# Patient Record
Sex: Female | Born: 1975 | Race: White | Hispanic: No | Marital: Married | Smoking: Never smoker
Health system: Southern US, Community
[De-identification: ages and names within clinical notes are randomized; demographics above are authoritative.]

## PROBLEM LIST (undated history)

## (undated) DIAGNOSIS — R112 Nausea with vomiting, unspecified: Secondary | ICD-10-CM

## (undated) DIAGNOSIS — M549 Dorsalgia, unspecified: Secondary | ICD-10-CM

## (undated) DIAGNOSIS — M5416 Radiculopathy, lumbar region: Secondary | ICD-10-CM

## (undated) DIAGNOSIS — M51369 Other intervertebral disc degeneration, lumbar region without mention of lumbar back pain or lower extremity pain: Secondary | ICD-10-CM

## (undated) DIAGNOSIS — M533 Sacrococcygeal disorders, not elsewhere classified: Secondary | ICD-10-CM

## (undated) DIAGNOSIS — G8929 Other chronic pain: Secondary | ICD-10-CM

## (undated) DIAGNOSIS — M5136 Other intervertebral disc degeneration, lumbar region: Secondary | ICD-10-CM

## (undated) DIAGNOSIS — Z9889 Other specified postprocedural states: Secondary | ICD-10-CM

## (undated) HISTORY — PX: WISDOM TOOTH EXTRACTION: SHX21

## (undated) HISTORY — PX: BREAST ENHANCEMENT SURGERY: SHX7

## (undated) HISTORY — PX: TUBAL LIGATION: SHX77

## (undated) HISTORY — PX: OTHER SURGICAL HISTORY: SHX169

---

## 2006-11-02 ENCOUNTER — Emergency Department (HOSPITAL_COMMUNITY): Admission: EM | Admit: 2006-11-02 | Discharge: 2006-11-02 | Payer: Self-pay | Admitting: Emergency Medicine

## 2010-06-04 ENCOUNTER — Inpatient Hospital Stay (HOSPITAL_COMMUNITY): Admission: RE | Admit: 2010-06-04 | Discharge: 2010-06-07 | Payer: Self-pay | Admitting: Obstetrics and Gynecology

## 2010-06-04 ENCOUNTER — Encounter (HOSPITAL_COMMUNITY): Payer: Self-pay | Admitting: Obstetrics and Gynecology

## 2011-01-24 LAB — TYPE AND SCREEN: Antibody Screen: NEGATIVE

## 2011-01-24 LAB — GLUCOSE, CAPILLARY: Glucose-Capillary: 108 mg/dL — ABNORMAL HIGH (ref 70–99)

## 2011-01-24 LAB — CBC
Hemoglobin: 11.9 g/dL — ABNORMAL LOW (ref 12.0–15.0)
Hemoglobin: 9.1 g/dL — ABNORMAL LOW (ref 12.0–15.0)
MCH: 29.9 pg (ref 26.0–34.0)
MCH: 30 pg (ref 26.0–34.0)
MCHC: 33.6 g/dL (ref 30.0–36.0)
MCV: 88.7 fL (ref 78.0–100.0)
Platelets: 185 10*3/uL (ref 150–400)
Platelets: 243 10*3/uL (ref 150–400)
RBC: 3.05 MIL/uL — ABNORMAL LOW (ref 3.87–5.11)
RBC: 3.95 MIL/uL (ref 3.87–5.11)
WBC: 6.7 10*3/uL (ref 4.0–10.5)

## 2011-01-24 LAB — ABO/RH: ABO/RH(D): O POS

## 2011-01-24 LAB — RPR: RPR Ser Ql: NONREACTIVE

## 2015-02-27 ENCOUNTER — Other Ambulatory Visit: Payer: Self-pay | Admitting: Obstetrics and Gynecology

## 2015-02-28 LAB — CYTOLOGY - PAP

## 2017-11-18 ENCOUNTER — Emergency Department (HOSPITAL_BASED_OUTPATIENT_CLINIC_OR_DEPARTMENT_OTHER)
Admission: EM | Admit: 2017-11-18 | Discharge: 2017-11-18 | Disposition: A | Discharge status: Home / Self Care | Payer: BLUE CROSS/BLUE SHIELD | Attending: Emergency Medicine | Admitting: Emergency Medicine

## 2017-11-18 ENCOUNTER — Encounter (HOSPITAL_BASED_OUTPATIENT_CLINIC_OR_DEPARTMENT_OTHER): Payer: Self-pay | Admitting: Emergency Medicine

## 2017-11-18 ENCOUNTER — Other Ambulatory Visit: Payer: Self-pay

## 2017-11-18 DIAGNOSIS — M545 Low back pain, unspecified: Secondary | ICD-10-CM

## 2017-11-18 DIAGNOSIS — Z79899 Other long term (current) drug therapy: Secondary | ICD-10-CM | POA: Insufficient documentation

## 2017-11-18 HISTORY — DX: Dorsalgia, unspecified: M54.9

## 2017-11-18 HISTORY — DX: Other intervertebral disc degeneration, lumbar region without mention of lumbar back pain or lower extremity pain: M51.369

## 2017-11-18 HISTORY — DX: Sacrococcygeal disorders, not elsewhere classified: M53.3

## 2017-11-18 HISTORY — DX: Other chronic pain: G89.29

## 2017-11-18 HISTORY — DX: Radiculopathy, lumbar region: M54.16

## 2017-11-18 HISTORY — DX: Other intervertebral disc degeneration, lumbar region: M51.36

## 2017-11-18 MED ORDER — HYDROCODONE-ACETAMINOPHEN 5-325 MG PO TABS
2.0000 | ORAL_TABLET | Freq: Once | ORAL | Status: AC
Start: 2017-11-18 — End: 2017-11-18
  Administered 2017-11-18: 2 via ORAL
  Filled 2017-11-18: qty 2

## 2017-11-18 MED ORDER — HYDROCODONE-ACETAMINOPHEN 5-325 MG PO TABS: 1.0000 | ORAL_TABLET | Freq: Four times a day (QID) | ORAL | 0 refills | Status: DC | PRN

## 2017-11-18 MED ORDER — PREDNISONE 10 MG PO TABS: 20.0000 mg | ORAL_TABLET | Freq: Two times a day (BID) | ORAL | 0 refills | Status: DC

## 2017-11-18 MED ORDER — KETOROLAC TROMETHAMINE 60 MG/2ML IM SOLN
60.0000 mg | Freq: Once | INTRAMUSCULAR | Status: AC
  Administered 2017-11-18: 60 mg via INTRAMUSCULAR
  Filled 2017-11-18: qty 2

## 2017-11-18 NOTE — ED Triage Notes (Signed)
Pt c/o lower back pain. Pt states she has chronic back pain but has flared up over last week and significantly worsened last night.

## 2017-11-18 NOTE — Discharge Instructions (Signed)
Prednisone as prescribed.  Hydrocodone is prescribed as needed for pain.  Follow-up with your orthopedist or neurosurgeon in the next week for recheck.

## 2017-11-18 NOTE — ED Provider Notes (Addendum)
[facility] EMERGENCY DEPARTMENT Provider Note   CSN: [id number] Arrival date & time: 11/18/17  [id number]     History   Chief Complaint Chief Complaint  Patient presents with  . Back Pain    HPI Ashley SpearingJennifer L Petty is a 42 y.o. female.  Patient is a 42 year old female with past medical history of chronic low back pain presenting for evaluation of back pain.  She reports pain in her low back that started 2 days ago in the absence of any injury or trauma.  She denies any radiation of her pain into her legs.  She denies any bowel or bladder complaints.   The history is provided by the patient.  Back Pain   This is a recurrent problem. The current episode started 2 days ago. The problem occurs constantly. The problem has not changed since onset.The pain is associated with no known injury. The pain is present in the lumbar spine. The quality of the pain is described as stabbing. The pain does not radiate. The pain is severe. The symptoms are aggravated by bending, twisting and certain positions. The pain is the same all the time. Pertinent negatives include no numbness, no bowel incontinence, no bladder incontinence, no paresthesias, no tingling and no weakness. Treatments tried: Meloxicam. The treatment provided mild relief.    Past Medical History:  Diagnosis Date  . Back pain, chronic     There are no active problems to display for this patient.   Past Surgical History:  Procedure Laterality Date  . BREAST ENHANCEMENT SURGERY    . WISDOM TOOTH EXTRACTION      OB History    No data available       Home Medications    Prior to Admission medications   Medication Sig Start Date End Date Taking? Authorizing Provider  acetaminophen (TYLENOL) 325 MG tablet Take 650 mg by mouth as needed.   Yes [provider]  meloxicam (MOBIC) 15 MG tablet Take 15 mg by mouth daily.   Yes [provider]    Family History No family history on file.  Social  History Social History   Tobacco Use  . Smoking status: Never Smoker  . Smokeless tobacco: Never Used  Substance Use Topics  . Alcohol use: No    Frequency: Never  . Drug use: No     Allergies   Patient has no known allergies.   Review of Systems Review of Systems  Gastrointestinal: Negative for bowel incontinence.  Genitourinary: Negative for bladder incontinence.  Musculoskeletal: Positive for back pain.  Neurological: Negative for tingling, weakness, numbness and paresthesias.  All other systems reviewed and are negative.    Physical Exam Updated Vital Signs BP 112/70 (BP Location: Left Arm)   Pulse 73   Temp 98.7 F (37.1 C) (Oral)   Resp 20   Ht 5\' 7"  (1.702 m)   Wt 61.2 kg (135 lb)   SpO2 100%   BMI 21.14 kg/m   Physical Exam  Constitutional: She is oriented to person, place, and time. She appears well-developed and well-nourished. No distress.  HENT:  Head: Normocephalic and atraumatic.  Neck: Normal range of motion. Neck supple.  Musculoskeletal:  There is tenderness to palpation in the soft tissues of the lower lumbar region.  Neurological: She is alert and oriented to person, place, and time.  Strength is 5 out of 5.  She is ambulatory on her heels and toes without difficulty.  DTRs are 2+ and symmetrical in both patellar  and Achilles tendons.  Skin: Skin is warm and dry. She is not diaphoretic.  Nursing note and vitals reviewed.    ED Treatments / Results  Labs (all labs ordered are listed, but only abnormal results are displayed) Labs Reviewed - No data to display  EKG  EKG Interpretation None       Radiology No results found.  Procedures Procedures (including critical care time)  Medications Ordered in ED Medications  ketorolac (TORADOL) injection 60 mg (not administered)  HYDROcodone-acetaminophen (NORCO/VICODIN) 5-325 MG per tablet 2 tablet (not administered)     Initial Impression / Assessment and Plan / ED Course  I have  reviewed the triage vital signs and the nursing notes.  Pertinent labs & imaging results that were available during my care of the patient were reviewed by me and considered in my medical decision making (see chart for details).  Patient presents here with a flareup of her chronic back pain.  There are no red flags in her history or physical examination that would suggest an emergent situation.  She was given Toradol and Vicodin with some relief, however she does still complain of discomfort.  I have advised her to call her spine doctor this morning to arrange a follow-up appointment to discuss imaging studies or repeat steroid injections.  She will be given prednisone and hydrocodone.  The [facility] narcotic database has been reviewed and the patient has had one prescription in the last 2 years.  Final Clinical Impressions(s) / ED Diagnoses   Final diagnoses:  None    ED Discharge Orders    None       Geoffery Lyons, MD 11/18/17 [id number]    Geoffery Lyons, MD 11/18/17 [phone]

## 2017-11-22 ENCOUNTER — Other Ambulatory Visit: Payer: Self-pay | Admitting: Orthopedic Surgery

## 2017-11-22 DIAGNOSIS — M5416 Radiculopathy, lumbar region: Secondary | ICD-10-CM

## 2017-11-26 ENCOUNTER — Ambulatory Visit
Admission: RE | Admit: 2017-11-26 | Discharge: 2017-11-26 | Disposition: A | Discharge status: Home / Self Care | Payer: BLUE CROSS/BLUE SHIELD | Source: Ambulatory Visit | Attending: Orthopedic Surgery | Admitting: Orthopedic Surgery

## 2017-11-26 DIAGNOSIS — M5416 Radiculopathy, lumbar region: Secondary | ICD-10-CM

## 2017-12-02 ENCOUNTER — Other Ambulatory Visit: Payer: Self-pay | Admitting: Orthopedic Surgery

## 2017-12-08 ENCOUNTER — Encounter (HOSPITAL_COMMUNITY): Payer: Self-pay | Admitting: *Deleted

## 2017-12-08 ENCOUNTER — Other Ambulatory Visit: Payer: Self-pay

## 2017-12-08 NOTE — Progress Notes (Signed)
Spoke with pt for pre-op call. Pt denies cardiac history, chest pain, sob or diabetes. 

## 2017-12-08 NOTE — Anesthesia Preprocedure Evaluation (Addendum)
Anesthesia Evaluation  Patient identified by MRN, date of birth, ID band Patient awake    Reviewed: Allergy & Precautions, H&P , Patient's Chart, lab work & pertinent test results, reviewed documented beta blocker date and time   History of Anesthesia Complications (+) PONV and history of anesthetic complications  Airway Mallampati: II  TM Distance: >3 FB Neck ROM: full    Dental no notable dental hx. (+) Teeth Intact, Dental Advisory Given   Pulmonary    Pulmonary exam normal breath sounds clear to auscultation       Cardiovascular  Rhythm:regular Rate:Normal     Neuro/Psych    GI/Hepatic   Endo/Other    Renal/GU      Musculoskeletal   Abdominal   Peds  Hematology   Anesthesia Other Findings   Reproductive/Obstetrics                            Anesthesia Physical Anesthesia Plan  ASA: II  Anesthesia Plan: General   Post-op Pain Management:    Induction: Intravenous  PONV Risk Score and Plan: 3 and Dexamethasone, Ondansetron, Treatment may vary due to age or medical condition and Scopolamine patch - Pre-op  Airway Management Planned: Oral ETT  Additional Equipment:   Intra-op Plan:   Post-operative Plan: Extubation in OR  Informed Consent: I have reviewed the patients History and Physical, chart, labs and discussed the procedure including the risks, benefits and alternatives for the proposed anesthesia with the patient or authorized representative who has indicated his/her understanding and acceptance.   Dental Advisory Given  Plan Discussed with: CRNA and Surgeon  Anesthesia Plan Comments: (  )        Anesthesia Quick Evaluation

## 2017-12-09 ENCOUNTER — Ambulatory Visit (HOSPITAL_COMMUNITY): Payer: BLUE CROSS/BLUE SHIELD | Admitting: Anesthesiology

## 2017-12-09 ENCOUNTER — Ambulatory Visit (HOSPITAL_COMMUNITY)
Admission: RE | Admit: 2017-12-09 | Discharge: 2017-12-09 | Disposition: A | Discharge status: Home / Self Care | Payer: BLUE CROSS/BLUE SHIELD | Source: Ambulatory Visit | Attending: Orthopedic Surgery | Admitting: Orthopedic Surgery

## 2017-12-09 ENCOUNTER — Ambulatory Visit (HOSPITAL_COMMUNITY): Payer: BLUE CROSS/BLUE SHIELD

## 2017-12-09 ENCOUNTER — Ambulatory Visit (HOSPITAL_COMMUNITY)
Admission: RE | Disposition: A | Discharge status: Home / Self Care | Payer: Self-pay | Source: Ambulatory Visit | Attending: Orthopedic Surgery

## 2017-12-09 ENCOUNTER — Other Ambulatory Visit: Payer: Self-pay

## 2017-12-09 ENCOUNTER — Encounter (HOSPITAL_COMMUNITY): Payer: Self-pay | Admitting: *Deleted

## 2017-12-09 DIAGNOSIS — G8929 Other chronic pain: Secondary | ICD-10-CM | POA: Insufficient documentation

## 2017-12-09 DIAGNOSIS — Z820 Family history of epilepsy and other diseases of the nervous system: Secondary | ICD-10-CM | POA: Diagnosis not present

## 2017-12-09 DIAGNOSIS — Z79891 Long term (current) use of opiate analgesic: Secondary | ICD-10-CM | POA: Insufficient documentation

## 2017-12-09 DIAGNOSIS — Z791 Long term (current) use of non-steroidal anti-inflammatories (NSAID): Secondary | ICD-10-CM | POA: Insufficient documentation

## 2017-12-09 DIAGNOSIS — Z01818 Encounter for other preprocedural examination: Secondary | ICD-10-CM

## 2017-12-09 DIAGNOSIS — M5410 Radiculopathy, site unspecified: Secondary | ICD-10-CM

## 2017-12-09 DIAGNOSIS — M5117 Intervertebral disc disorders with radiculopathy, lumbosacral region: Secondary | ICD-10-CM | POA: Diagnosis present

## 2017-12-09 DIAGNOSIS — Z9889 Other specified postprocedural states: Secondary | ICD-10-CM | POA: Diagnosis not present

## 2017-12-09 DIAGNOSIS — Z9851 Tubal ligation status: Secondary | ICD-10-CM | POA: Diagnosis not present

## 2017-12-09 DIAGNOSIS — Z888 Allergy status to other drugs, medicaments and biological substances status: Secondary | ICD-10-CM | POA: Diagnosis not present

## 2017-12-09 DIAGNOSIS — Z793 Long term (current) use of hormonal contraceptives: Secondary | ICD-10-CM | POA: Diagnosis not present

## 2017-12-09 DIAGNOSIS — Z79899 Other long term (current) drug therapy: Secondary | ICD-10-CM | POA: Insufficient documentation

## 2017-12-09 HISTORY — DX: Other specified postprocedural states: Z98.890

## 2017-12-09 HISTORY — PX: LUMBAR LAMINECTOMY: SHX95

## 2017-12-09 HISTORY — DX: Nausea with vomiting, unspecified: R11.2

## 2017-12-09 LAB — COMPREHENSIVE METABOLIC PANEL
ALK PHOS: 37 U/L — AB (ref 38–126)
ALT: 19 U/L (ref 14–54)
AST: 22 U/L (ref 15–41)
Albumin: 3.9 g/dL (ref 3.5–5.0)
Anion gap: 12 (ref 5–15)
BILIRUBIN TOTAL: 1 mg/dL (ref 0.3–1.2)
BUN: 15 mg/dL (ref 6–20)
CALCIUM: 9 mg/dL (ref 8.9–10.3)
CO2: 22 mmol/L (ref 22–32)
Chloride: 105 mmol/L (ref 101–111)
Creatinine, Ser: 0.62 mg/dL (ref 0.44–1.00)
Glucose, Bld: 106 mg/dL — ABNORMAL HIGH (ref 65–99)
Potassium: 3.7 mmol/L (ref 3.5–5.1)
Sodium: 139 mmol/L (ref 135–145)
Total Protein: 6.6 g/dL (ref 6.5–8.1)

## 2017-12-09 LAB — URINALYSIS, ROUTINE W REFLEX MICROSCOPIC
BILIRUBIN URINE: NEGATIVE
GLUCOSE, UA: NEGATIVE mg/dL
Ketones, ur: NEGATIVE mg/dL
LEUKOCYTES UA: NEGATIVE
Nitrite: NEGATIVE
PH: 5 (ref 5.0–8.0)
PROTEIN: NEGATIVE mg/dL
Specific Gravity, Urine: 1.023 (ref 1.005–1.030)

## 2017-12-09 LAB — PROTIME-INR
INR: 0.99
PROTHROMBIN TIME: 13 s (ref 11.4–15.2)

## 2017-12-09 LAB — CBC WITH DIFFERENTIAL/PLATELET
BASOS ABS: 0 10*3/uL (ref 0.0–0.1)
Basophils Relative: 1 %
Eosinophils Absolute: 0.1 10*3/uL (ref 0.0–0.7)
Eosinophils Relative: 2 %
HEMATOCRIT: 41.7 % (ref 36.0–46.0)
HEMOGLOBIN: 13.7 g/dL (ref 12.0–15.0)
LYMPHS ABS: 2.5 10*3/uL (ref 0.7–4.0)
LYMPHS PCT: 38 %
MCH: 30.6 pg (ref 26.0–34.0)
MCHC: 32.9 g/dL (ref 30.0–36.0)
MCV: 93.3 fL (ref 78.0–100.0)
Monocytes Absolute: 0.4 10*3/uL (ref 0.1–1.0)
Monocytes Relative: 6 %
NEUTROS ABS: 3.6 10*3/uL (ref 1.7–7.7)
NEUTROS PCT: 53 %
Platelets: 215 10*3/uL (ref 150–400)
RBC: 4.47 MIL/uL (ref 3.87–5.11)
RDW: 13 % (ref 11.5–15.5)
WBC: 6.6 10*3/uL (ref 4.0–10.5)

## 2017-12-09 LAB — TYPE AND SCREEN
ABO/RH(D): O POS
Antibody Screen: NEGATIVE

## 2017-12-09 LAB — POCT PREGNANCY, URINE: Preg Test, Ur: NEGATIVE

## 2017-12-09 LAB — APTT: APTT: 30 s (ref 24–36)

## 2017-12-09 LAB — ABO/RH: ABO/RH(D): O POS

## 2017-12-09 SURGERY — MICRODISCECTOMY LUMBAR LAMINECTOMY
Anesthesia: General | Laterality: Right

## 2017-12-09 MED ORDER — PHENYLEPHRINE HCL 10 MG/ML IJ SOLN
INTRAMUSCULAR | Status: DC | PRN
  Administered 2017-12-09 (×3): 80 ug via INTRAVENOUS
  Administered 2017-12-09: 120 ug via INTRAVENOUS
  Administered 2017-12-09: 80 ug via INTRAVENOUS

## 2017-12-09 MED ORDER — THROMBIN 20000 UNITS EX SOLR
CUTANEOUS | Status: AC
  Filled 2017-12-09: qty 20000

## 2017-12-09 MED ORDER — BUPIVACAINE-EPINEPHRINE (PF) 0.5% -1:200000 IJ SOLN
INTRAMUSCULAR | Status: AC
  Filled 2017-12-09: qty 30

## 2017-12-09 MED ORDER — ONDANSETRON HCL 4 MG/2ML IJ SOLN
INTRAMUSCULAR | Status: DC | PRN
  Administered 2017-12-09: 4 mg via INTRAVENOUS

## 2017-12-09 MED ORDER — NEOSTIGMINE METHYLSULFATE 10 MG/10ML IV SOLN
INTRAVENOUS | Status: DC | PRN
Start: 2017-12-09 — End: 2017-12-09
  Administered 2017-12-09: 3.5 mg via INTRAVENOUS

## 2017-12-09 MED ORDER — THROMBIN 5000 UNITS EX SOLR
CUTANEOUS | Status: AC
  Filled 2017-12-09: qty 5000

## 2017-12-09 MED ORDER — LIDOCAINE HCL (CARDIAC) 20 MG/ML IV SOLN
INTRAVENOUS | Status: DC | PRN
  Administered 2017-12-09: 100 mg via INTRAVENOUS

## 2017-12-09 MED ORDER — METHYLPREDNISOLONE ACETATE 40 MG/ML IJ SUSP
INTRAMUSCULAR | Status: DC | PRN
  Administered 2017-12-09: 40 mg

## 2017-12-09 MED ORDER — FENTANYL CITRATE (PF) 100 MCG/2ML IJ SOLN
INTRAMUSCULAR | Status: DC | PRN
  Administered 2017-12-09: 150 ug via INTRAVENOUS
  Administered 2017-12-09: 50 ug via INTRAVENOUS

## 2017-12-09 MED ORDER — HYDROMORPHONE HCL 1 MG/ML IJ SOLN
0.2500 mg | INTRAMUSCULAR | Status: DC | PRN
  Administered 2017-12-09 (×4): 0.5 mg via INTRAVENOUS

## 2017-12-09 MED ORDER — GLYCOPYRROLATE 0.2 MG/ML IJ SOLN
INTRAMUSCULAR | Status: DC | PRN
  Administered 2017-12-09: 0.4 mg via INTRAVENOUS

## 2017-12-09 MED ORDER — DEXAMETHASONE SODIUM PHOSPHATE 10 MG/ML IJ SOLN
INTRAMUSCULAR | Status: AC
  Filled 2017-12-09: qty 1

## 2017-12-09 MED ORDER — PHENYLEPHRINE HCL 10 MG/ML IJ SOLN
INTRAVENOUS | Status: DC | PRN
  Administered 2017-12-09: 25 ug/min via INTRAVENOUS

## 2017-12-09 MED ORDER — HYDROMORPHONE HCL 1 MG/ML IJ SOLN
INTRAMUSCULAR | Status: AC
  Administered 2017-12-09: 0.5 mg via INTRAVENOUS
  Filled 2017-12-09: qty 1

## 2017-12-09 MED ORDER — MIDAZOLAM HCL 5 MG/5ML IJ SOLN
INTRAMUSCULAR | Status: DC | PRN
  Administered 2017-12-09: 2 mg via INTRAVENOUS

## 2017-12-09 MED ORDER — ROCURONIUM BROMIDE 100 MG/10ML IV SOLN
INTRAVENOUS | Status: DC | PRN
  Administered 2017-12-09: 50 mg via INTRAVENOUS

## 2017-12-09 MED ORDER — HEMOSTATIC AGENTS (NO CHARGE) OPTIME
TOPICAL | Status: DC | PRN
  Administered 2017-12-09: 1 via TOPICAL

## 2017-12-09 MED ORDER — ONDANSETRON HCL 4 MG/2ML IJ SOLN
INTRAMUSCULAR | Status: AC
  Filled 2017-12-09: qty 2

## 2017-12-09 MED ORDER — 0.9 % SODIUM CHLORIDE (POUR BTL) OPTIME
TOPICAL | Status: DC | PRN
  Administered 2017-12-09: 1000 mL

## 2017-12-09 MED ORDER — METHYLPREDNISOLONE ACETATE 40 MG/ML IJ SUSP
INTRAMUSCULAR | Status: AC
  Filled 2017-12-09: qty 1

## 2017-12-09 MED ORDER — FENTANYL CITRATE (PF) 250 MCG/5ML IJ SOLN
INTRAMUSCULAR | Status: AC
  Filled 2017-12-09: qty 5

## 2017-12-09 MED ORDER — MIDAZOLAM HCL 2 MG/2ML IJ SOLN
INTRAMUSCULAR | Status: AC
  Filled 2017-12-09: qty 2

## 2017-12-09 MED ORDER — BUPIVACAINE-EPINEPHRINE 0.5% -1:200000 IJ SOLN
INTRAMUSCULAR | Status: DC | PRN
  Administered 2017-12-09: 20 mL

## 2017-12-09 MED ORDER — POVIDONE-IODINE 7.5 % EX SOLN
Freq: Once | CUTANEOUS | Status: DC
  Filled 2017-12-09: qty 118

## 2017-12-09 MED ORDER — THROMBIN (RECOMBINANT) 20000 UNITS EX SOLR
CUTANEOUS | Status: DC | PRN
  Administered 2017-12-09: 20000 [IU] via TOPICAL

## 2017-12-09 MED ORDER — ACETAMINOPHEN 160 MG/5ML PO SOLN
960.0000 mg | Freq: Once | ORAL | Status: AC
  Administered 2017-12-09: 960 mg via ORAL
  Filled 2017-12-09: qty 40.6

## 2017-12-09 MED ORDER — LIDOCAINE 2% (20 MG/ML) 5 ML SYRINGE
INTRAMUSCULAR | Status: AC
  Filled 2017-12-09: qty 5

## 2017-12-09 MED ORDER — LACTATED RINGERS IV SOLN
INTRAVENOUS | Status: DC | PRN
  Administered 2017-12-09 (×2): via INTRAVENOUS

## 2017-12-09 MED ORDER — PROPOFOL 10 MG/ML IV BOLUS
INTRAVENOUS | Status: AC
  Filled 2017-12-09: qty 20

## 2017-12-09 MED ORDER — PROPOFOL 10 MG/ML IV BOLUS
INTRAVENOUS | Status: DC | PRN
  Administered 2017-12-09: 130 mg via INTRAVENOUS

## 2017-12-09 MED ORDER — INDIGOTINDISULFONATE SODIUM 8 MG/ML IJ SOLN
INTRAMUSCULAR | Status: DC | PRN
  Administered 2017-12-09: .03 mL via INTRAVENOUS

## 2017-12-09 MED ORDER — SCOPOLAMINE 1 MG/3DAYS TD PT72
MEDICATED_PATCH | TRANSDERMAL | Status: DC | PRN
  Administered 2017-12-09: 1 via TRANSDERMAL

## 2017-12-09 MED ORDER — CEFAZOLIN SODIUM-DEXTROSE 2-4 GM/100ML-% IV SOLN
2.0000 g | INTRAVENOUS | Status: AC
  Administered 2017-12-09: 2 g via INTRAVENOUS
  Filled 2017-12-09: qty 100

## 2017-12-09 MED ORDER — METHYLENE BLUE 0.5 % INJ SOLN
INTRAVENOUS | Status: AC
  Filled 2017-12-09: qty 10

## 2017-12-09 MED ORDER — DEXAMETHASONE SODIUM PHOSPHATE 10 MG/ML IJ SOLN
INTRAMUSCULAR | Status: DC | PRN
  Administered 2017-12-09: 10 mg via INTRAVENOUS

## 2017-12-09 MED ORDER — BUPIVACAINE LIPOSOME 1.3 % IJ SUSP
20.0000 mL | INTRAMUSCULAR | Status: AC
  Administered 2017-12-09: 20 mL
  Filled 2017-12-09: qty 20

## 2017-12-09 MED ORDER — SCOPOLAMINE 1 MG/3DAYS TD PT72
MEDICATED_PATCH | TRANSDERMAL | Status: AC
  Filled 2017-12-09: qty 1

## 2017-12-09 MED ORDER — ROCURONIUM BROMIDE 10 MG/ML (PF) SYRINGE
PREFILLED_SYRINGE | INTRAVENOUS | Status: AC
  Filled 2017-12-09: qty 5

## 2017-12-09 SURGICAL SUPPLY — 66 items
APL SKNCLS STERI-STRIP NONHPOA (GAUZE/BANDAGES/DRESSINGS) ×1
BENZOIN TINCTURE PRP APPL 2/3 (GAUZE/BANDAGES/DRESSINGS) ×1 IMPLANT
BUR ROUND PRECISION 4.0 (BURR) ×1 IMPLANT
CANISTER SUCT 3000ML PPV (MISCELLANEOUS) ×2 IMPLANT
CONT SPEC 4OZ CLIKSEAL STRL BL (MISCELLANEOUS) ×2 IMPLANT
COVER SURGICAL LIGHT HANDLE (MISCELLANEOUS) ×2 IMPLANT
DRAIN CHANNEL 10F 3/8 F FF (DRAIN) IMPLANT
DRAPE POUCH INSTRU U-SHP 10X18 (DRAPES) ×3 IMPLANT
DRAPE SURG 17X23 STRL (DRAPES) ×8 IMPLANT
DURAPREP 26ML APPLICATOR (WOUND CARE) ×2 IMPLANT
ELECT BLADE 4.0 EZ CLEAN MEGAD (MISCELLANEOUS) ×2
ELECT BLADE 6.5 EXT (BLADE) IMPLANT
ELECT CAUTERY BLADE 6.4 (BLADE) ×2 IMPLANT
ELECT REM PT RETURN 9FT ADLT (ELECTROSURGICAL) ×2
ELECTRODE BLDE 4.0 EZ CLN MEGD (MISCELLANEOUS) IMPLANT
ELECTRODE REM PT RTRN 9FT ADLT (ELECTROSURGICAL) ×1 IMPLANT
EVACUATOR SILICONE 100CC (DRAIN) IMPLANT
FILTER STRAW FLUID ASPIR (MISCELLANEOUS) ×2 IMPLANT
GAUZE SPONGE 4X4 12PLY STRL (GAUZE/BANDAGES/DRESSINGS) ×2 IMPLANT
GAUZE SPONGE 4X4 16PLY XRAY LF (GAUZE/BANDAGES/DRESSINGS) ×4 IMPLANT
GLOVE BIO SURGEON STRL SZ7 (GLOVE) ×2 IMPLANT
GLOVE BIO SURGEON STRL SZ8 (GLOVE) ×2 IMPLANT
GLOVE BIOGEL PI IND STRL 7.0 (GLOVE) ×1 IMPLANT
GLOVE BIOGEL PI IND STRL 8 (GLOVE) ×1 IMPLANT
GLOVE BIOGEL PI INDICATOR 7.0 (GLOVE) ×4
GLOVE BIOGEL PI INDICATOR 8 (GLOVE) ×1
GLOVE SURG SS PI 6.5 STRL IVOR (GLOVE) ×2 IMPLANT
GOWN STRL REUS W/ TWL LRG LVL3 (GOWN DISPOSABLE) ×2 IMPLANT
GOWN STRL REUS W/TWL LRG LVL3 (GOWN DISPOSABLE) ×4
IV CATH 14GX2 1/4 (CATHETERS) ×2 IMPLANT
KIT BASIN OR (CUSTOM PROCEDURE TRAY) ×2 IMPLANT
KIT ROOM TURNOVER OR (KITS) ×2 IMPLANT
NDL 18GX1X1/2 (RX/OR ONLY) (NEEDLE) ×1 IMPLANT
NDL HYPO 25GX1X1/2 BEV (NEEDLE) ×1 IMPLANT
NDL SPNL 18GX3.5 QUINCKE PK (NEEDLE) ×2 IMPLANT
NDL SPNL 22GX3.5 QUINCKE BK (NEEDLE) IMPLANT
NEEDLE 18GX1X1/2 (RX/OR ONLY) (NEEDLE) ×2 IMPLANT
NEEDLE HYPO 25GX1X1/2 BEV (NEEDLE) ×4 IMPLANT
NEEDLE SPNL 18GX3.5 QUINCKE PK (NEEDLE) ×4 IMPLANT
NEEDLE SPNL 22GX3.5 QUINCKE BK (NEEDLE) IMPLANT
NS IRRIG 1000ML POUR BTL (IV SOLUTION) ×2 IMPLANT
PACK LAMINECTOMY ORTHO (CUSTOM PROCEDURE TRAY) ×2 IMPLANT
PACK UNIVERSAL I (CUSTOM PROCEDURE TRAY) ×2 IMPLANT
PAD ARMBOARD 7.5X6 YLW CONV (MISCELLANEOUS) ×4 IMPLANT
PATTIES SURGICAL .5 X.5 (GAUZE/BANDAGES/DRESSINGS) IMPLANT
PATTIES SURGICAL .5 X1 (DISPOSABLE) ×2 IMPLANT
PATTIES SURGICAL 1X1 (DISPOSABLE) IMPLANT
SPONGE SURGIFOAM ABS GEL 100 (HEMOSTASIS) ×2 IMPLANT
STRIP CLOSURE SKIN 1/2X4 (GAUZE/BANDAGES/DRESSINGS) ×1 IMPLANT
SUT ETHILON 3 0 FSL (SUTURE) IMPLANT
SUT VIC AB 0 CT1 27 (SUTURE)
SUT VIC AB 0 CT1 27XBRD ANBCTR (SUTURE) IMPLANT
SUT VIC AB 0 CT2 27 (SUTURE) ×1 IMPLANT
SUT VIC AB 1 CT1 18XCR BRD 8 (SUTURE) ×1 IMPLANT
SUT VIC AB 1 CT1 8-18 (SUTURE) ×4
SUT VIC AB 2-0 CT2 18 VCP726D (SUTURE) ×2 IMPLANT
SYR 20CC LL (SYRINGE) IMPLANT
SYR BULB IRRIGATION 50ML (SYRINGE) ×2 IMPLANT
SYR CONTROL 10ML LL (SYRINGE) ×2 IMPLANT
SYR TB 1ML 26GX3/8 SAFETY (SYRINGE) ×4 IMPLANT
SYR TB 1ML LUER SLIP (SYRINGE) ×4 IMPLANT
TAPE CLOTH SURG 4X10 WHT LF (GAUZE/BANDAGES/DRESSINGS) ×1 IMPLANT
TOWEL OR 17X24 6PK STRL BLUE (TOWEL DISPOSABLE) ×2 IMPLANT
TOWEL OR 17X26 10 PK STRL BLUE (TOWEL DISPOSABLE) ×2 IMPLANT
WATER STERILE IRR 1000ML POUR (IV SOLUTION) ×1 IMPLANT
YANKAUER SUCT BULB TIP NO VENT (SUCTIONS) ×2 IMPLANT

## 2017-12-09 NOTE — Op Note (Signed)
NAMJerolyn Shin:  Alto, Mehak           ACCOUNT NO.:  [id number]  MEDICAL RECORD NO.:  [id number]  LOCATION:  [facility]                         FACILITY:  [facility]  PHYSICIAN:  Estill BambergMark Dashanna Kinnamon, MD      DATE OF BIRTH:  [birth year]  DATE OF PROCEDURE:  12/09/2017                              OPERATIVE REPORT   PREOPERATIVE DIAGNOSES: 1. Right-sided S1 radiculopathy. 2. 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  POSTOPERATIVE DIAGNOSES: 1. Right-sided S1 radiculopathy. 2. 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  PROCEDURES:  Right-sided L5-S1 laminotomy with partial facetectomy and removal of very large herniated right-sided L5-S1 disk fragment.  SURGEON:  Estill BambergMark Dezman Granda, MD.  ASSISTANJason Coop:  Kayla McKenzie, PA-C.  ANESTHESIA:  General endotracheal anesthesia.  COMPLICATIONS:  None.  DISPOSITION:  Stable.  ESTIMATED BLOOD LOSS:  Minimal.  INDICATIONS FOR SURGERY:  Briefly, Ashley Petty is a pleasant 42 year old female, who did present to me with severe pain in her low back and right leg.  The patient's MRI did reveal the findings outlined above, clearly notable for a very large herniated disk fragment.  Her pain was rather severe and she did have weakness in her leg as well.  We did discuss treatment options and we did ultimately elect to proceed with the procedure reflected above.  The patient was fully made aware of the risks of surgery, including the risk of recurrent herniation and the need for subsequent surgery, including the possibility of a subsequent diskectomy and/or fusion.  OPERATIVE DETAILS:  On December 09, 2017, the patient was brought to Surgery and general endotracheal anesthesia was administered.  The patient was placed prone on a well-padded flat Jackson bed with a spinal frame.  Antibiotics were given.  The back was prepped and draped and a time-out procedure  was performed.  At this point, a midline incision was made directly over the L5-S1 intervertebral space.  A curvilinear incision was made just to the right of the midline into the fascia.  A self-retaining McCulloch retractor was placed.  The lamina of L5 and S1 was identified and subperiosteally exposed.  I then removed the lateral aspect of the L5-S1 ligamentum flavum.  Readily identified was the traversing right S1 nerve, which was noted to be under obvious tension, and was noted to be rather erythematous.  I was able to gently gain medial retraction of the nerve, and in doing so, a very large herniated disk fragment was readily noted.  This was removed in one very large fragment.  This substantially lessened the tension on the nerve.  I then additionally explored the lateral recess, and with additional medial retraction of the nerve, an additional very large herniation was readily encountered, and was uneventfully removed.  I was very pleased with the final decompression that I was able to accomplish.  At this point, the wound was copiously irrigated with normal saline.  All epidural bleeding was controlled using bipolar electrocautery in addition to Surgiflo. All bleeding was controlled at the termination of the procedure.  At this point, 20 mg of Depo-Medrol was introduced about the epidural space  in the region of the right S1 nerve.  The wound was then closed in layers using #1 Vicryl followed by 0 Vicryl, followed by 4-0 Monocryl. Benzoin and Steri-Strips were applied followed by a sterile dressing. All instrument counts were correct at the termination of the procedure.  Of note, Jason Coop was my assistant throughout surgery, and did aid in retraction, suctioning, and closure from start to finish.     Estill Bamberg, MD     MD/MEDQ  D:  12/09/2017  T:  12/09/2017  Job:  [id number]

## 2017-12-09 NOTE — Transfer of Care (Signed)
Immediate Anesthesia Transfer of Care Note  Patient: Shella SpearingJennifer L Heggs  Procedure(s) Performed: RIGHT SIDED LUMBAR 5-SACRUM 1 MICRODISCECTOMY REQUESTED TIME 2 HRS (Right )  Patient Location: PACU  Anesthesia Type:General  Level of Consciousness: oriented, drowsy and patient cooperative  Airway & Oxygen Therapy: Patient Spontanous Breathing and Patient connected to nasal cannula oxygen  Post-op Assessment: Report given to RN and Post -op Vital signs reviewed and stable  Post vital signs: Reviewed  Last Vitals:  Vitals:   12/09/17 0647  BP: 121/68  Pulse: 61  Resp: 18  Temp: 37.1 C  SpO2: 100%    Last Pain:  Vitals:   12/09/17 0647  TempSrc: Oral  PainSc:       Patients Stated Pain Goal: 2 (12/09/17 [id number])  Complications: No apparent anesthesia complications

## 2017-12-09 NOTE — Anesthesia Procedure Notes (Signed)
Procedure Name: Intubation Date/Time: 12/09/2017 7:38 AM Performed by: Lovie Cholock, Tristan K, CRNA Pre-anesthesia Checklist: Patient identified, Emergency Drugs available, Suction available and Patient being monitored Patient Re-evaluated:Patient Re-evaluated prior to induction Oxygen Delivery Method: Circle System Utilized Preoxygenation: Pre-oxygenation with 100% oxygen Induction Type: IV induction Ventilation: Mask ventilation without difficulty Laryngoscope Size: Miller and 2 Grade View: Grade I Tube type: Oral Tube size: 7.0 mm Number of attempts: 1 Airway Equipment and Method: Stylet and Oral airway Placement Confirmation: ETT inserted through vocal cords under direct vision,  positive ETCO2 and breath sounds checked- equal and bilateral Secured at: 21 cm Tube secured with: Tape Dental Injury: Teeth and Oropharynx as per pre-operative assessment

## 2017-12-09 NOTE — Anesthesia Postprocedure Evaluation (Signed)
Anesthesia Post Note  Patient: Shella SpearingJennifer L Hogenson  Procedure(s) Performed: RIGHT SIDED LUMBAR 5-SACRUM 1 MICRODISCECTOMY REQUESTED TIME 2 HRS (Right )     Patient location during evaluation: PACU Anesthesia Type: General Level of consciousness: awake and alert Pain management: pain level controlled Vital Signs Assessment: post-procedure vital signs reviewed and stable Respiratory status: spontaneous breathing, nonlabored ventilation, respiratory function stable and patient connected to nasal cannula oxygen Cardiovascular status: blood pressure returned to baseline and stable Postop Assessment: no apparent nausea or vomiting Anesthetic complications: no    Last Vitals:  Vitals:   12/09/17 1100 12/09/17 1110  BP: (!) 96/58   Pulse: 62 62  Resp: 10 12  Temp:    SpO2: 95% 96%    Last Pain:  Vitals:   12/09/17 1054  TempSrc:   PainSc: 4                  Izayiah Tibbitts EDWARD

## 2017-12-09 NOTE — H&P (Signed)
PREOPERATIVE H&P  Chief Complaint: Right leg pain  HPI: [name] is Ashley 42 y.o. female who presents with ongoing pain in the right leg  MRI reveals Ashley very large right L5/S1 HNP  Patient has failed multiple forms of conservative care and continues to have pain (see office notes for additional details regarding the patient's full course of treatment)  Past Medical History:  Diagnosis Date  . Back pain, chronic   . DDD (degenerative disc disease), lumbar   . Lumbar radiculopathy   . PONV (postoperative nausea and vomiting)   . Sacroiliac dysfunction    Past Surgical History:  Procedure Laterality Date  . BREAST ENHANCEMENT SURGERY    . c-sections     x 4  . TUBAL LIGATION    . WISDOM TOOTH EXTRACTION     Social History   Socioeconomic History  . Marital status: Married    Spouse name: None  . Number of children: None  . Years of education: None  . Highest education level: None  Social Needs  . Financial resource strain: None  . Food insecurity - worry: None  . Food insecurity - inability: None  . Transportation needs - medical: None  . Transportation needs - non-medical: None  Occupational History  . None  Tobacco Use  . Smoking status: Never Smoker  . Smokeless tobacco: Never Used  Substance and Sexual Activity  . Alcohol use: No    Frequency: Never  . Drug use: No  . Sexual activity: None  Other Topics Concern  . None  Social History Narrative  . None   Family History  Problem Relation Age of Onset  . Alzheimer's disease Father    Allergies  Allergen Reactions  . Tramadol Nausea And Vomiting    Lightheaded and weakness    Prior to Admission medications   Medication Sig Start Date End Date Taking? Authorizing Provider  acetaminophen (TYLENOL) 500 MG tablet Take 1,000 mg by mouth every 8 (eight) hours as needed for mild pain or moderate pain.   Yes [provider]  Capsaicin (CAPSAICIN HOT PATCH) 0.025 % PADS Apply 1 patch  topically daily as needed (PAIN).   Yes [provider]  HYDROcodone-acetaminophen (NORCO) 5-325 MG tablet Take 1-2 tablets by mouth every 6 (six) hours as needed. Patient taking differently: Take 1 tablet by mouth every 8 (eight) hours as needed for moderate pain.  11/18/17  Yes Delo, Riley Lam, MD  LO LOESTRIN FE 1 MG-10 MCG / 10 MCG tablet TAKE 1 TABLET BY MOUTH AT DAILY BEDTIME 11/24/17  Yes [provider]  meloxicam (MOBIC) 15 MG tablet Take 15 mg by mouth daily.   Yes [provider]  methocarbamol (ROBAXIN) 500 MG tablet Take 500 mg by mouth 2 (two) times daily.   Yes [provider]  NON FORMULARY Take 2 tablets by mouth daily. Doterra Alpha CRS cellular vitality supplement   Yes [provider]  NON FORMULARY Take 2 tablets by mouth daily. Doterra Micro Plex   Yes [provider]  NON FORMULARY Take 2 tablets by mouth daily. Doterra XEO Mega supplement   Yes [provider]  predniSONE (DELTASONE) 10 MG tablet Take 2 tablets (20 mg total) by mouth 2 (two) times daily. Patient not taking: Reported on 12/02/2017 11/18/17   Geoffery Lyons, MD     All other systems have been reviewed and were otherwise negative with the exception of those mentioned in the HPI and as above.  Physical Exam: Vitals:   12/09/17 0647  BP: 121/68  Pulse: 61  Resp: 18  Temp: 98.8 F (37.1 C)  SpO2: 100%    Body mass index is 21.14 kg/m.  General: Alert, no acute distress Cardiovascular: No pedal edema Respiratory: No cyanosis, no use of accessory musculature Skin: No lesions in the area of chief complaint Neurologic: Sensation intact distally Psychiatric: Patient is competent for consent with normal mood and affect Lymphatic: No axillary or cervical lymphadenopathy  MUSCULOSKELETAL: + SLR on the right  Assessment/Plan: RIGHT LEG PAIN AND NUMBNESS Plan for Procedure(s): RIGHT SIDED LUMBAR 5-SACRUM 1 MICRODISCECTOMY   Emilee HeroUMONSKI,Makyi Ledo  LEONARD, MD 12/09/2017 7:06 AM

## 2017-12-10 ENCOUNTER — Encounter (HOSPITAL_COMMUNITY): Payer: Self-pay | Admitting: Orthopedic Surgery

## 2017-12-10 MED FILL — Thrombin For Soln 20000 Unit: CUTANEOUS | Qty: 1 | Status: AC

## 2018-02-05 IMAGING — CR DG LUMBAR SPINE 2-3V
2 series · 2 of 2 positions shown · non-contrast
Comparison: MRI November 26, 2017.

CLINICAL DATA: L5-S1 micro discectomy.

EXAM:
LUMBAR SPINE - 2-3 VIEW

[lateral (1 of 2)]
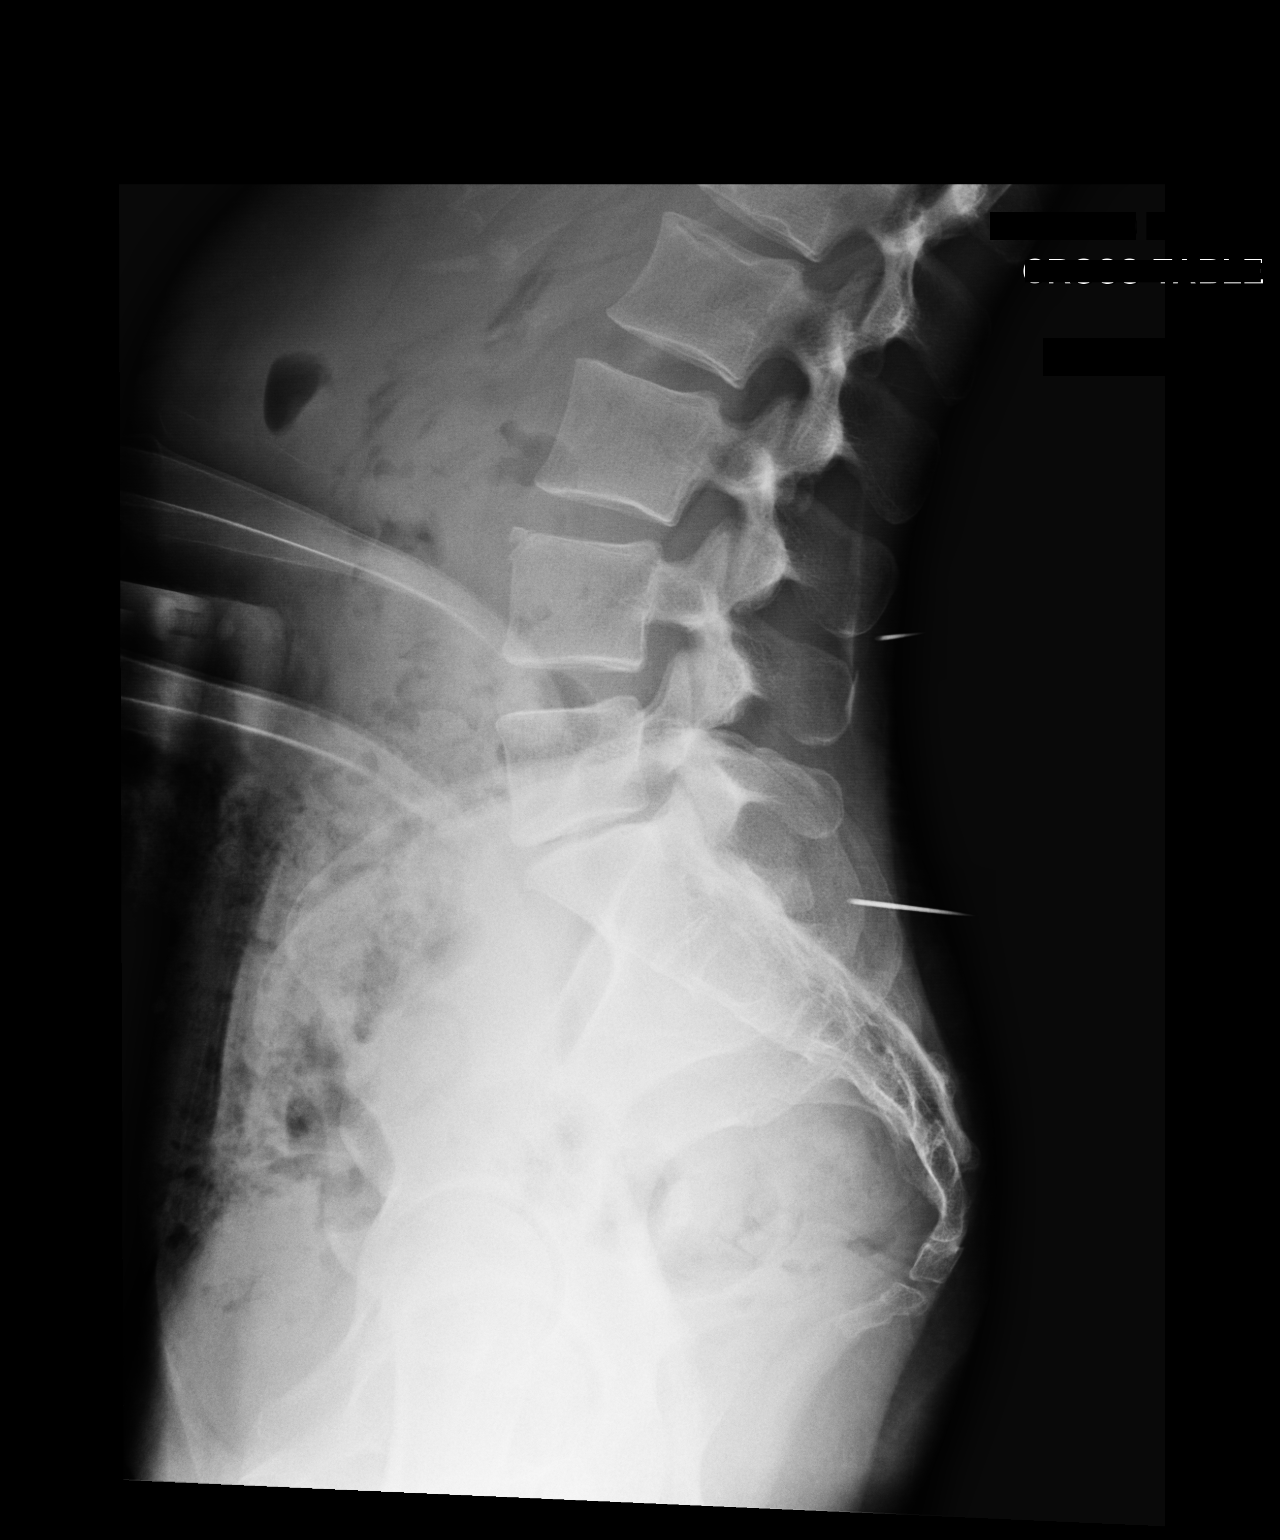

[lateral (2 of 2)]
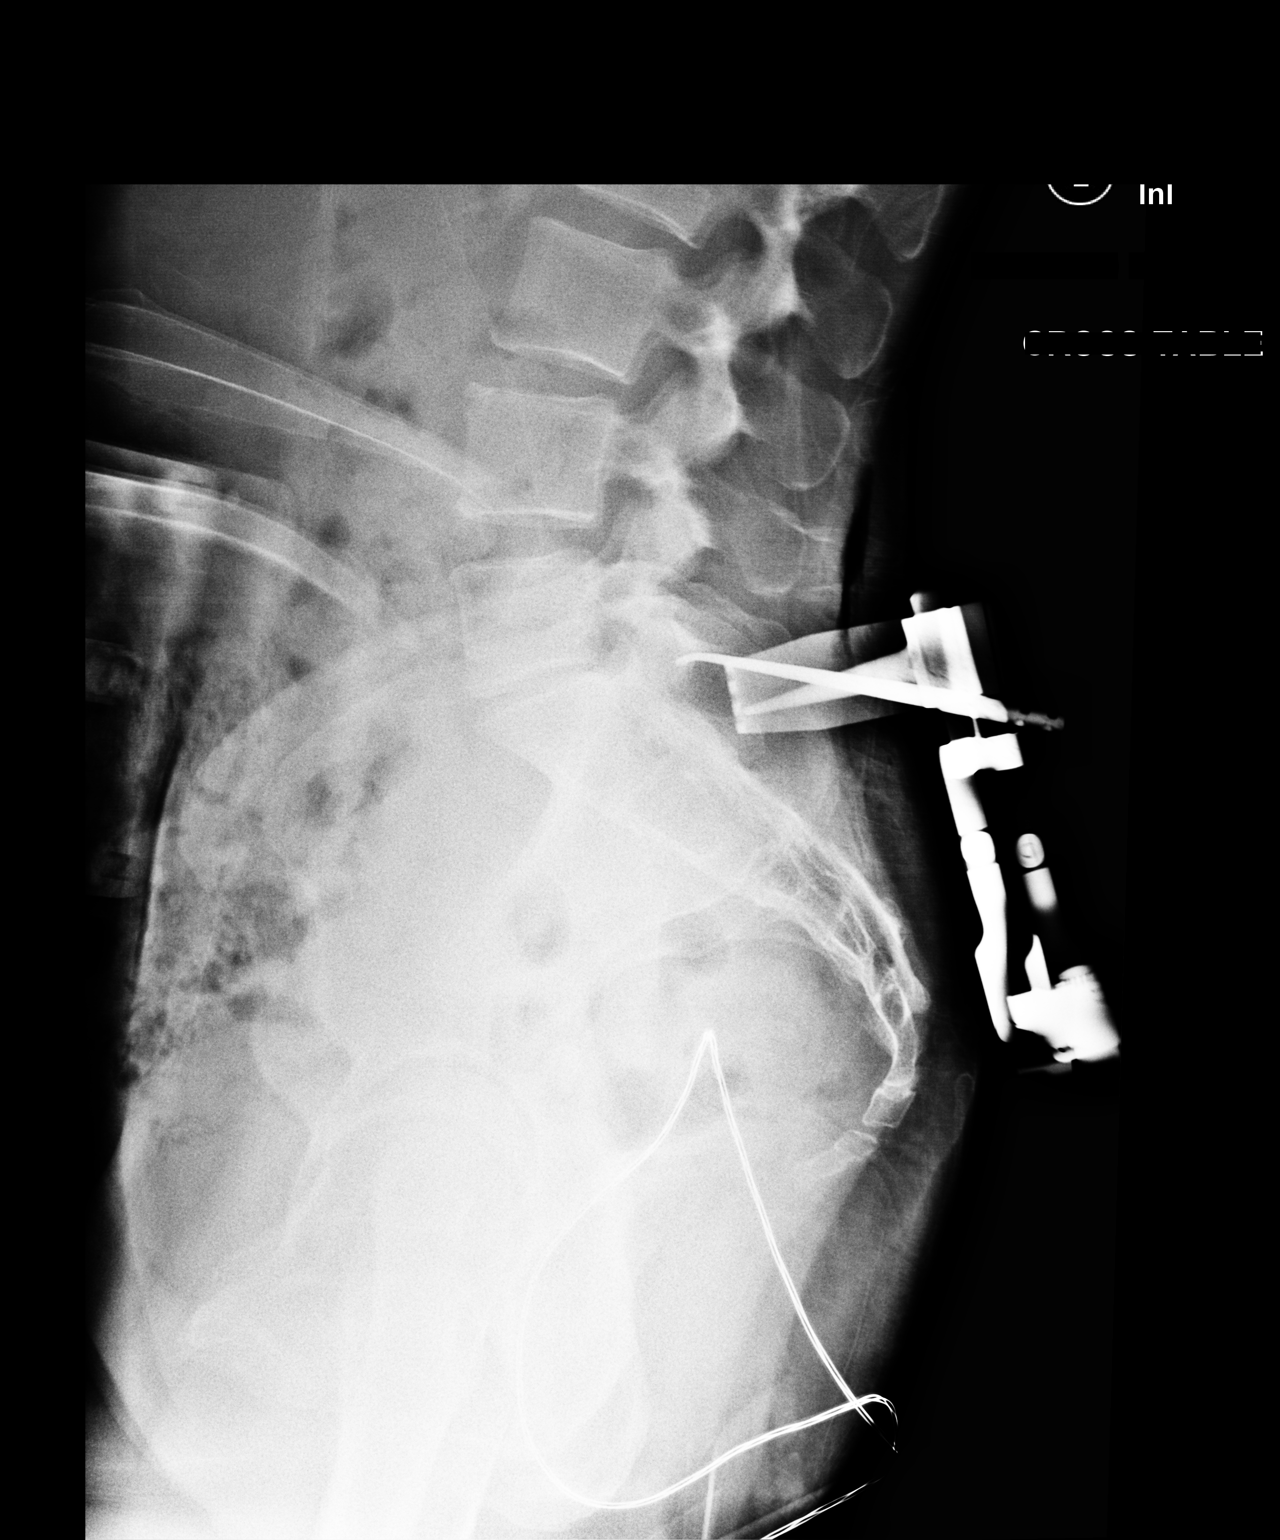

[2 of 2 positions shown; findings below may reference images not displayed]

FINDINGS: Two intraoperative cross-table lateral projections were obtained of
the lumbar spine. The first image demonstrates surgical probes
directed toward the posterior interspinous space of L3-4 and the S1
level. The second image demonstrates surgical probe directed toward
the posterior margin of L5-S1.
IMPRESSION: Surgical localization as described above.

## 2019-10-13 ENCOUNTER — Other Ambulatory Visit: Payer: Self-pay

## 2019-10-13 DIAGNOSIS — Z20822 Contact with and (suspected) exposure to covid-19: Secondary | ICD-10-CM

## 2019-10-14 LAB — NOVEL CORONAVIRUS, NAA: SARS-CoV-2, NAA: NOT DETECTED

## 2020-07-30 ENCOUNTER — Ambulatory Visit (INDEPENDENT_AMBULATORY_CARE_PROVIDER_SITE_OTHER): Payer: Self-pay | Admitting: Dermatology

## 2020-07-30 ENCOUNTER — Other Ambulatory Visit: Payer: Self-pay

## 2020-07-30 DIAGNOSIS — L988 Other specified disorders of the skin and subcutaneous tissue: Secondary | ICD-10-CM

## 2020-07-30 NOTE — Progress Notes (Signed)
   Follow-Up Visit   Subjective  Ashley Petty is a 44 y.o. female who presents for the following: Facial Elastosis (Botox today).  The following portions of the chart were reviewed this encounter and updated as appropriate:  Tobacco  Allergies  Meds  Problems  Med Hx  Surg Hx  Fam Hx     Review of Systems:  No other skin or systemic complaints except as noted in HPI or Assessment and Plan.  Objective  Well appearing patient in no apparent distress; mood and affect are within normal limits.  A focused examination was performed including face. Relevant physical exam findings are noted in the Assessment and Plan.  Objective  Face: Rhytides and volume loss.   Images           Assessment & Plan  Elastosis of skin Face  Frown Complex - 20 units Forehead - 5 units   Continue Elastin Eye Complex  Botox Injection - Face Location: See attached image  Informed consent: Discussed risks (infection, pain, bleeding, bruising, swelling, allergic reaction, paralysis of nearby muscles, eyelid droop, double vision, neck weakness, difficulty breathing, headache, undesirable cosmetic result, and need for additional treatment) and benefits of the procedure, as well as the alternatives.  Informed consent was obtained.  Preparation: The area was cleansed with alcohol.  Procedure Details:  Botox was injected into the dermis with a 30-gauge needle. Pressure applied to any bleeding. Ice packs offered for swelling.  Lot Number:  [id number] Expiration:  09/2022  Total Units Injected:  25  Plan: Patient was instructed to remain upright for 4 hours. Patient was instructed to avoid massaging the face and avoid vigorous exercise for the rest of the day. Tylenol may be used for headache.  Allow 2 weeks before returning to clinic for additional dosing as needed. Patient will call for any problems.   Return for Follow up in 1 month then Botox in 3-4 months.  I, Joanie Coddington, CMA,  am acting as scribe for Armida Sans, MD .  Documentation: I have reviewed the above documentation for accuracy and completeness, and I agree with the above.  Armida Sans, MD

## 2020-07-31 ENCOUNTER — Encounter: Payer: Self-pay | Admitting: Dermatology

## 2021-03-04 ENCOUNTER — Ambulatory Visit (INDEPENDENT_AMBULATORY_CARE_PROVIDER_SITE_OTHER): Payer: Self-pay | Admitting: Dermatology

## 2021-03-04 ENCOUNTER — Other Ambulatory Visit: Payer: Self-pay

## 2021-03-04 DIAGNOSIS — L988 Other specified disorders of the skin and subcutaneous tissue: Secondary | ICD-10-CM

## 2021-03-04 NOTE — Progress Notes (Signed)
   Follow-Up Visit   Subjective  Ashley Petty is a 45 y.o. female who presents for the following: Facial Elastosis (Botox today).  The following portions of the chart were reviewed this encounter and updated as appropriate:   Tobacco  Allergies  Meds  Problems  Med Hx  Surg Hx  Fam Hx     Review of Systems:  No other skin or systemic complaints except as noted in HPI or Assessment and Plan.  Objective  Well appearing patient in no apparent distress; mood and affect are within normal limits.  A focused examination was performed including face. Relevant physical exam findings are noted in the Assessment and Plan.  Objective  Face: Rhytides and volume loss.   Images             Assessment & Plan  Elastosis of skin Face  Botox today - 35 units total  Frown Complex 20 units Forehead 5 units Crow's Feet 5 units each side  Botox Injection - Face Location: See attached image  Informed consent: Discussed risks (infection, pain, bleeding, bruising, swelling, allergic reaction, paralysis of nearby muscles, eyelid droop, double vision, neck weakness, difficulty breathing, headache, undesirable cosmetic result, and need for additional treatment) and benefits of the procedure, as well as the alternatives.  Informed consent was obtained.  Preparation: The area was cleansed with alcohol.  Procedure Details:  Botox was injected into the dermis with a 30-gauge needle. Pressure applied to any bleeding. Ice packs offered for swelling.  Lot Number:  [id number] Expiration:  04/2023  Total Units Injected:  35  Plan: Patient was instructed to remain upright for 4 hours. Patient was instructed to avoid massaging the face and avoid vigorous exercise for the rest of the day. Tylenol may be used for headache.  Allow 2 weeks before returning to clinic for additional dosing as needed. Patient will call for any problems.   Return for Botox in 3-4 months.   I, Joanie Coddington,  CMA, am acting as scribe for Armida Sans, MD .  Documentation: I have reviewed the above documentation for accuracy and completeness, and I agree with the above.  Armida Sans, MD

## 2021-03-04 NOTE — Patient Instructions (Signed)

## 2021-03-06 ENCOUNTER — Encounter: Payer: Self-pay | Admitting: Dermatology

## 2021-09-02 ENCOUNTER — Other Ambulatory Visit: Payer: Self-pay

## 2021-09-02 ENCOUNTER — Ambulatory Visit (INDEPENDENT_AMBULATORY_CARE_PROVIDER_SITE_OTHER): Payer: Self-pay | Admitting: Dermatology

## 2021-09-02 DIAGNOSIS — L988 Other specified disorders of the skin and subcutaneous tissue: Secondary | ICD-10-CM

## 2021-09-02 NOTE — Patient Instructions (Signed)

## 2021-09-02 NOTE — Progress Notes (Signed)
   Follow-Up Visit   Subjective  Ashley Petty is a 45 y.o. female who presents for the following: Facial Elastosis (Botox today).  The following portions of the chart were reviewed this encounter and updated as appropriate:   Tobacco  Allergies  Meds  Problems  Med Hx  Surg Hx  Fam Hx     Review of Systems:  No other skin or systemic complaints except as noted in HPI or Assessment and Plan.  Objective  Well appearing patient in no apparent distress; mood and affect are within normal limits.  A focused examination was performed including face. Relevant physical exam findings are noted in the Assessment and Plan.  Face Rhytides and volume loss.       Assessment & Plan  Elastosis of skin Face  Botox today - 35 units  Frown complex 20 units  Forehead 5 units Crow's Feet 5 units each side  Filling material injection - Face Location: See attached image  Informed consent: Discussed risks (infection, pain, bleeding, bruising, swelling, allergic reaction, paralysis of nearby muscles, eyelid droop, double vision, neck weakness, difficulty breathing, headache, undesirable cosmetic result, and need for additional treatment) and benefits of the procedure, as well as the alternatives.  Informed consent was obtained.  Preparation: The area was cleansed with alcohol.  Procedure Details:  Botox was injected into the dermis with a 30-gauge needle. Pressure applied to any bleeding. Ice packs offered for swelling.  Lot Number:  [id number] Expiration:  05/2023  Total Units Injected:  35  Plan: Patient was instructed to remain upright for 4 hours. Patient was instructed to avoid massaging the face and avoid vigorous exercise for the rest of the day. Tylenol may be used for headache.  Allow 2 weeks before returning to clinic for additional dosing as needed. Patient will call for any problems.   Return for Botox in 3-4 months.  I, Joanie Coddington, CMA, am acting as scribe for  Armida Sans, MD . Documentation: I have reviewed the above documentation for accuracy and completeness, and I agree with the above.  Armida Sans, MD

## 2021-09-03 ENCOUNTER — Encounter: Payer: Self-pay | Admitting: Dermatology

## 2022-02-03 ENCOUNTER — Other Ambulatory Visit: Payer: Self-pay

## 2022-02-03 ENCOUNTER — Ambulatory Visit (INDEPENDENT_AMBULATORY_CARE_PROVIDER_SITE_OTHER): Payer: Self-pay | Admitting: Dermatology

## 2022-02-03 DIAGNOSIS — L988 Other specified disorders of the skin and subcutaneous tissue: Secondary | ICD-10-CM

## 2022-02-03 NOTE — Patient Instructions (Signed)

## 2022-02-03 NOTE — Progress Notes (Signed)
? ?  Follow-Up Visit ?  ?Subjective  ?Ashley Petty is a 46 y.o. female who presents for the following: Facial Elastosis (Botox today). ? ?The following portions of the chart were reviewed this encounter and updated as appropriate:  ? Tobacco  Allergies  Meds  Problems  Med Hx  Surg Hx  Fam Hx   ?  ?Review of Systems:  No other skin or systemic complaints except as noted in HPI or Assessment and Plan. ? ?Objective  ?Well appearing patient in no apparent distress; mood and affect are within normal limits. ? ?A focused examination was performed including face. Relevant physical exam findings are noted in the Assessment and Plan. ? ?Face ?Rhytides and volume loss.  ? ? ? ? ? ?Assessment & Plan  ?Elastosis of skin ?Face ? ?Botox today - 35 units total ? ?20 units frown complex ?5 units forehead ?5 units each side crows feet ? ?Botox Injection - Face ?Location: See attached image ? ?Informed consent: Discussed risks (infection, pain, bleeding, bruising, swelling, allergic reaction, paralysis of nearby muscles, eyelid droop, double vision, neck weakness, difficulty breathing, headache, undesirable cosmetic result, and need for additional treatment) and benefits of the procedure, as well as the alternatives.  Informed consent was obtained. ? ?Preparation: The area was cleansed with alcohol. ? ?Procedure Details:  Botox was injected into the dermis with a 30-gauge needle. Pressure applied to any bleeding. Ice packs offered for swelling. ? ?Lot Number:  [id number] ?Expiration:  01/2024 ? ?Total Units Injected:  35 ? ?Plan: Patient was instructed to remain upright for 4 hours. Patient was instructed to avoid massaging the face and avoid vigorous exercise for the rest of the day. Tylenol may be used for headache.  Allow 2 weeks before returning to clinic for additional dosing as needed. Patient will call for any problems. ? ?Return for Botox in 3-4 months. ? ?I, Joanie Coddington, CMA, am acting as scribe for Armida Sans, MD . ?Documentation: I have reviewed the above documentation for accuracy and completeness, and I agree with the above. ? ?Armida Sans, MD ? ?

## 2022-02-05 ENCOUNTER — Encounter: Payer: Self-pay | Admitting: Dermatology

## 2022-07-07 ENCOUNTER — Ambulatory Visit: Payer: BLUE CROSS/BLUE SHIELD | Admitting: Dermatology

## 2022-07-21 ENCOUNTER — Ambulatory Visit (INDEPENDENT_AMBULATORY_CARE_PROVIDER_SITE_OTHER): Payer: Self-pay | Admitting: Dermatology

## 2022-07-21 DIAGNOSIS — L988 Other specified disorders of the skin and subcutaneous tissue: Secondary | ICD-10-CM

## 2022-07-21 NOTE — Progress Notes (Signed)
   Follow-Up Visit   Subjective  Ashley Petty is a 46 y.o. female who presents for the following: Facial Elastosis (Patient is here today for Botox injections).  The following portions of the chart were reviewed this encounter and updated as appropriate:   Tobacco  Allergies  Meds  Problems  Med Hx  Surg Hx  Fam Hx     Review of Systems:  No other skin or systemic complaints except as noted in HPI or Assessment and Plan.  Objective  Well appearing patient in no apparent distress; mood and affect are within normal limits.  A focused examination was performed including the face. Relevant physical exam findings are noted in the Assessment and Plan.  Face Rhytides and volume loss.             Assessment & Plan  Elastosis of skin Face Botox 35 units injected as marked:  - Frown complex 20 units - Crow's feet 5 units each for at total of 10 units - Forehead 5 units across the forehead  Botox Injection - Face Location: See attached image  Informed consent: Discussed risks (infection, pain, bleeding, bruising, swelling, allergic reaction, paralysis of nearby muscles, eyelid droop, double vision, neck weakness, difficulty breathing, headache, undesirable cosmetic result, and need for additional treatment) and benefits of the procedure, as well as the alternatives.  Informed consent was obtained.  Preparation: The area was cleansed with alcohol.  Procedure Details:  Botox was injected into the dermis with a 30-gauge needle. Pressure applied to any bleeding. Ice packs offered for swelling.  Lot Number:  [id number] Expiration:  09/2024  Total Units Injected:  35  Plan: Patient was instructed to remain upright for 4 hours. Patient was instructed to avoid massaging the face and avoid vigorous exercise for the rest of the day. Tylenol may be used for headache.  Allow 2 weeks before returning to clinic for additional dosing as needed. Patient will call for any  problems.  Return in about 4 months (around 11/20/2022) for Botox injections.  Maylene Roes, CMA, am acting as scribe for Armida Sans, MD . Documentation: I have reviewed the above documentation for accuracy and completeness, and I agree with the above.  Armida Sans, MD

## 2022-07-21 NOTE — Patient Instructions (Signed)
Due to recent changes in healthcare laws, you may see results of your pathology and/or laboratory studies on MyChart before the doctors have had a chance to review them. We understand that in some cases there may be results that are confusing or concerning to you. Please understand that not all results are received at the same time and often the doctors may need to interpret multiple results in order to provide you with the best plan of care or course of treatment. Therefore, we ask that you please give us 2 business days to thoroughly review all your results before contacting the office for clarification. Should we see a critical lab result, you will be contacted sooner.   If You Need Anything After Your Visit  If you have any questions or concerns for your doctor, please call our main line at [phone] and press option 4 to reach your doctor's medical assistant. If no one answers, please leave a voicemail as directed and we will return your call as soon as possible. Messages left after 4 pm will be answered the following business day.   You may also send us a message via MyChart. We typically respond to MyChart messages within 1-2 business days.  For prescription refills, please ask your pharmacy to contact our office. Our fax number is [phone].  If you have an urgent issue when the clinic is closed that cannot wait until the next business day, you can page your doctor at the number below.    Please note that while we do our best to be available for urgent issues outside of office hours, we are not available 24/7.   If you have an urgent issue and are unable to reach us, you may choose to seek medical care at your doctor's office, retail clinic, urgent care center, or emergency room.  If you have a medical emergency, please immediately call 911 or go to the emergency department.  Pager Numbers  - Dr. Kowalski: [phone]  - Dr. Moye: [phone]  - Dr. Stewart:  [phone]  In the event of inclement weather, please call our main line at [phone] for an update on the status of any delays or closures.  Dermatology Medication Tips: Please keep the boxes that topical medications come in in order to help keep track of the instructions about where and how to use these. Pharmacies typically print the medication instructions only on the boxes and not directly on the medication tubes.   If your medication is too expensive, please contact our office at [phone] option 4 or send us a message through MyChart.   We are unable to tell what your co-pay for medications will be in advance as this is different depending on your insurance coverage. However, we may be able to find a substitute medication at lower cost or fill out paperwork to get insurance to cover a needed medication.   If a prior authorization is required to get your medication covered by your insurance company, please allow us 1-2 business days to complete this process.  Drug prices often vary depending on where the prescription is filled and some pharmacies may offer cheaper prices.  The website www.goodrx.com contains coupons for medications through different pharmacies. The prices here do not account for what the cost may be with help from insurance (it may be cheaper with your insurance), but the website can give you the price if you did not use any insurance.  - You can print the associated coupon and take it with   your prescription to the pharmacy.  - You may also stop by our office during regular business hours and pick up a GoodRx coupon card.  - If you need your prescription sent electronically to a different pharmacy, notify our office through [place] MyChart or by phone at [phone] option 4.     Si Usted Necesita Algo Despus de Su Visita  Tambin puede enviarnos un mensaje a travs de MyChart. Por lo general respondemos a los mensajes de MyChart en el transcurso de 1 a 2  das hbiles.  Para renovar recetas, por favor pida a su farmacia que se ponga en contacto con nuestra oficina. Nuestro nmero de fax es el [phone].  Si tiene un asunto urgente cuando la clnica est cerrada y que no puede esperar hasta el siguiente da hbil, puede llamar/localizar a su doctor(a) al nmero que aparece a continuacin.   Por favor, tenga en cuenta que aunque hacemos todo lo posible para estar disponibles para asuntos urgentes fuera del horario de oficina, no estamos disponibles las 24 horas del da, los 7 das de la semana.   Si tiene un problema urgente y no puede comunicarse con nosotros, puede optar por buscar atencin mdica  en el consultorio de su doctor(a), en una clnica privada, en un centro de atencin urgente o en una sala de emergencias.  Si tiene una emergencia mdica, por favor llame inmediatamente al 911 o vaya a la sala de emergencias.  Nmeros de bper  - Dr. Kowalski: [phone]  - Dra. Moye: [phone]  - Dra. Stewart: [phone]  En caso de inclemencias del tiempo, por favor llame a nuestra lnea principal al [phone] para una actualizacin sobre el estado de cualquier retraso o cierre.  Consejos para la medicacin en dermatologa: Por favor, guarde las cajas en las que vienen los medicamentos de uso tpico para ayudarle a seguir las instrucciones sobre dnde y cmo usarlos. Las farmacias generalmente imprimen las instrucciones del medicamento slo en las cajas y no directamente en los tubos del medicamento.   Si su medicamento es muy caro, por favor, pngase en contacto con nuestra oficina llamando al [phone] y presione la opcin 4 o envenos un mensaje a travs de MyChart.   No podemos decirle cul ser su copago por los medicamentos por adelantado ya que esto es diferente dependiendo de la cobertura de su seguro. Sin embargo, es posible que podamos encontrar un medicamento sustituto a menor costo o llenar un formulario para que el  seguro cubra el medicamento que se considera necesario.   Si se requiere una autorizacin previa para que su compaa de seguros cubra su medicamento, por favor permtanos de 1 a 2 das hbiles para completar este proceso.  Los precios de los medicamentos varan con frecuencia dependiendo del lugar de dnde se surte la receta y alguna farmacias pueden ofrecer precios ms baratos.  El sitio web www.goodrx.com tiene cupones para medicamentos de diferentes farmacias. Los precios aqu no tienen en cuenta lo que podra costar con la ayuda del seguro (puede ser ms barato con su seguro), pero el sitio web puede darle el precio si no utiliz ningn seguro.  - Puede imprimir el cupn correspondiente y llevarlo con su receta a la farmacia.  - Tambin puede pasar por nuestra oficina durante el horario de atencin regular y recoger una tarjeta de cupones de GoodRx.  - Si necesita que su receta se enve electrnicamente a una farmacia diferente, informe a nuestra oficina a travs de MyChart de [place]   o por telfono llamando al [phone] y presione la opcin 4.  

## 2022-07-24 ENCOUNTER — Encounter: Payer: Self-pay | Admitting: Dermatology

## 2022-12-22 ENCOUNTER — Ambulatory Visit: Payer: BLUE CROSS/BLUE SHIELD | Admitting: Dermatology

## 2023-01-05 ENCOUNTER — Ambulatory Visit (INDEPENDENT_AMBULATORY_CARE_PROVIDER_SITE_OTHER): Payer: BLUE CROSS/BLUE SHIELD | Admitting: Dermatology

## 2023-01-05 VITALS — BP 107/67 | HR 70

## 2023-01-05 DIAGNOSIS — L988 Other specified disorders of the skin and subcutaneous tissue: Secondary | ICD-10-CM

## 2023-01-05 NOTE — Patient Instructions (Signed)
Due to recent changes in healthcare laws, you may see results of your pathology and/or laboratory studies on MyChart before the doctors have had a chance to review them. We understand that in some cases there may be results that are confusing or concerning to you. Please understand that not all results are received at the same time and often the doctors may need to interpret multiple results in order to provide you with the best plan of care or course of treatment. Therefore, we ask that you please give us 2 business days to thoroughly review all your results before contacting the office for clarification. Should we see a critical lab result, you will be contacted sooner.   If You Need Anything After Your Visit  If you have any questions or concerns for your doctor, please call our main line at [phone] and press option 4 to reach your doctor's medical assistant. If no one answers, please leave a voicemail as directed and we will return your call as soon as possible. Messages left after 4 pm will be answered the following business day.   You may also send us a message via MyChart. We typically respond to MyChart messages within 1-2 business days.  For prescription refills, please ask your pharmacy to contact our office. Our fax number is [phone].  If you have an urgent issue when the clinic is closed that cannot wait until the next business day, you can page your doctor at the number below.    Please note that while we do our best to be available for urgent issues outside of office hours, we are not available 24/7.   If you have an urgent issue and are unable to reach us, you may choose to seek medical care at your doctor's office, retail clinic, urgent care center, or emergency room.  If you have a medical emergency, please immediately call 911 or go to the emergency department.  Pager Numbers  - Dr. Kowalski: [phone]  - Dr. Moye: [phone]  - Dr. Stewart:  [phone]  In the event of inclement weather, please call our main line at [phone] for an update on the status of any delays or closures.  Dermatology Medication Tips: Please keep the boxes that topical medications come in in order to help keep track of the instructions about where and how to use these. Pharmacies typically print the medication instructions only on the boxes and not directly on the medication tubes.   If your medication is too expensive, please contact our office at [phone] option 4 or send us a message through MyChart.   We are unable to tell what your co-pay for medications will be in advance as this is different depending on your insurance coverage. However, we may be able to find a substitute medication at lower cost or fill out paperwork to get insurance to cover a needed medication.   If a prior authorization is required to get your medication covered by your insurance company, please allow us 1-2 business days to complete this process.  Drug prices often vary depending on where the prescription is filled and some pharmacies may offer cheaper prices.  The website www.goodrx.com contains coupons for medications through different pharmacies. The prices here do not account for what the cost may be with help from insurance (it may be cheaper with your insurance), but the website can give you the price if you did not use any insurance.  - You can print the associated coupon and take it with   your prescription to the pharmacy.  - You may also stop by our office during regular business hours and pick up a GoodRx coupon card.  - If you need your prescription sent electronically to a different pharmacy, notify our office through [place] MyChart or by phone at [phone] option 4.     Si Usted Necesita Algo Despus de Su Visita  Tambin puede enviarnos un mensaje a travs de MyChart. Por lo general respondemos a los mensajes de MyChart en el transcurso de 1 a 2  das hbiles.  Para renovar recetas, por favor pida a su farmacia que se ponga en contacto con nuestra oficina. Nuestro nmero de fax es el [phone].  Si tiene un asunto urgente cuando la clnica est cerrada y que no puede esperar hasta el siguiente da hbil, puede llamar/localizar a su doctor(a) al nmero que aparece a continuacin.   Por favor, tenga en cuenta que aunque hacemos todo lo posible para estar disponibles para asuntos urgentes fuera del horario de oficina, no estamos disponibles las 24 horas del da, los 7 das de la semana.   Si tiene un problema urgente y no puede comunicarse con nosotros, puede optar por buscar atencin mdica  en el consultorio de su doctor(a), en una clnica privada, en un centro de atencin urgente o en una sala de emergencias.  Si tiene una emergencia mdica, por favor llame inmediatamente al 911 o vaya a la sala de emergencias.  Nmeros de bper  - Dr. Kowalski: [phone]  - Dra. Moye: [phone]  - Dra. Stewart: [phone]  En caso de inclemencias del tiempo, por favor llame a nuestra lnea principal al [phone] para una actualizacin sobre el estado de cualquier retraso o cierre.  Consejos para la medicacin en dermatologa: Por favor, guarde las cajas en las que vienen los medicamentos de uso tpico para ayudarle a seguir las instrucciones sobre dnde y cmo usarlos. Las farmacias generalmente imprimen las instrucciones del medicamento slo en las cajas y no directamente en los tubos del medicamento.   Si su medicamento es muy caro, por favor, pngase en contacto con nuestra oficina llamando al [phone] y presione la opcin 4 o envenos un mensaje a travs de MyChart.   No podemos decirle cul ser su copago por los medicamentos por adelantado ya que esto es diferente dependiendo de la cobertura de su seguro. Sin embargo, es posible que podamos encontrar un medicamento sustituto a menor costo o llenar un formulario para que el  seguro cubra el medicamento que se considera necesario.   Si se requiere una autorizacin previa para que su compaa de seguros cubra su medicamento, por favor permtanos de 1 a 2 das hbiles para completar este proceso.  Los precios de los medicamentos varan con frecuencia dependiendo del lugar de dnde se surte la receta y alguna farmacias pueden ofrecer precios ms baratos.  El sitio web www.goodrx.com tiene cupones para medicamentos de diferentes farmacias. Los precios aqu no tienen en cuenta lo que podra costar con la ayuda del seguro (puede ser ms barato con su seguro), pero el sitio web puede darle el precio si no utiliz ningn seguro.  - Puede imprimir el cupn correspondiente y llevarlo con su receta a la farmacia.  - Tambin puede pasar por nuestra oficina durante el horario de atencin regular y recoger una tarjeta de cupones de GoodRx.  - Si necesita que su receta se enve electrnicamente a una farmacia diferente, informe a nuestra oficina a travs de MyChart de [place]   o por telfono llamando al [phone] y presione la opcin 4.  

## 2023-01-05 NOTE — Progress Notes (Signed)
   Follow-Up Visit   Subjective  Ashley Petty is a 47 y.o. female who presents for the following: Facial Elastosis (Patient here today for Botox injections).  The following portions of the chart were reviewed this encounter and updated as appropriate:   Tobacco  Allergies  Meds  Problems  Med Hx  Surg Hx  Fam Hx     Review of Systems:  No other skin or systemic complaints except as noted in HPI or Assessment and Plan.  Objective  Well appearing patient in no apparent distress; mood and affect are within normal limits.  A focused examination was performed including the face. Relevant physical exam findings are noted in the Assessment and Plan.  Face Rhytides and volume loss.       Assessment & Plan  Elastosis of skin Face  Botox 35 units injected as marked:  - Frown complex 20 units - Crow's feet 5 units each for at total of 10 units - Forehead 5 units across the forehead  Botox Injection - Face Location: See attached image  Informed consent: Discussed risks (infection, pain, bleeding, bruising, swelling, allergic reaction, paralysis of nearby muscles, eyelid droop, double vision, neck weakness, difficulty breathing, headache, undesirable cosmetic result, and need for additional treatment) and benefits of the procedure, as well as the alternatives.  Informed consent was obtained.  Preparation: The area was cleansed with alcohol.  Procedure Details:  Botox was injected into the dermis with a 30-gauge needle. Pressure applied to any bleeding. Ice packs offered for swelling.  Lot Number:  [id number] Expiration:  03/26  Total Units Injected:  35  Plan: Patient was instructed to remain upright for 4 hours. Patient was instructed to avoid massaging the face and avoid vigorous exercise for the rest of the day. Tylenol may be used for headache.  Allow 2 weeks before returning to clinic for additional dosing as needed. Patient will call for any problems.    Return  in about 4 months (around 05/06/2023) for botox injections.  Luther Redo, CMA, am acting as scribe for Sarina Ser, MD . Documentation: I have reviewed the above documentation for accuracy and completeness, and I agree with the above.  Sarina Ser, MD

## 2023-01-12 ENCOUNTER — Encounter: Payer: Self-pay | Admitting: Dermatology

## 2023-04-15 ENCOUNTER — Telehealth: Payer: Self-pay

## 2023-04-15 NOTE — Telephone Encounter (Signed)
Appt rescheduled to July 29 at 8:30 am  Provider out July 16

## 2023-05-25 ENCOUNTER — Ambulatory Visit: Payer: BLUE CROSS/BLUE SHIELD | Admitting: Dermatology

## 2023-06-07 ENCOUNTER — Ambulatory Visit (INDEPENDENT_AMBULATORY_CARE_PROVIDER_SITE_OTHER): Payer: Self-pay | Admitting: Dermatology

## 2023-06-07 ENCOUNTER — Encounter: Payer: Self-pay | Admitting: Dermatology

## 2023-06-07 VITALS — BP 107/66 | HR 71

## 2023-06-07 DIAGNOSIS — L988 Other specified disorders of the skin and subcutaneous tissue: Secondary | ICD-10-CM

## 2023-06-07 NOTE — Progress Notes (Signed)
   Follow-Up Visit   Subjective  Ashley Petty is a 47 y.o. female who presents for the following: Botox for facial elastosis  The following portions of the chart were reviewed this encounter and updated as appropriate: medications, allergies, medical history  Review of Systems:  No other skin or systemic complaints except as noted in HPI or Assessment and Plan.  Objective  Well appearing patient in no apparent distress; mood and affect are within normal limits.  A focused examination was performed of the face.  Relevant physical exam findings are noted in the Assessment and Plan.       Assessment & Plan    Facial Elastosis  face  Location: See attached image  Botox 35 units injected as marked:  - Frown complex 20 units - Crow's feet 5 units each for at total of 10 units - Forehead 5 units across the forehead   Informed consent: Discussed risks (infection, pain, bleeding, bruising, swelling, allergic reaction, paralysis of nearby muscles, eyelid droop, double vision, neck weakness, difficulty breathing, headache, undesirable cosmetic result, and need for additional treatment) and benefits of the procedure, as well as the alternatives.  Informed consent was obtained.  Preparation: The area was cleansed with alcohol.  Procedure Details:  Botox was injected into the dermis with a 30-gauge needle. Pressure applied to any bleeding. Ice packs offered for swelling.  Lot Number:  [id number]  Expiration:  04/2025  Total Units Injected:  35  Plan: Tylenol may be used for headache.  Allow 2 weeks before returning to clinic for additional dosing as needed. Patient will call for any problems.  No follow-ups on file.  IAsher Muir, CMA, am acting as scribe for Armida Sans, MD.   Documentation: I have reviewed the above documentation for accuracy and completeness, and I agree with the above.  Armida Sans, MD

## 2023-06-07 NOTE — Patient Instructions (Signed)

## 2023-11-16 ENCOUNTER — Ambulatory Visit: Payer: BLUE CROSS/BLUE SHIELD | Admitting: Dermatology

## 2023-11-30 ENCOUNTER — Ambulatory Visit (INDEPENDENT_AMBULATORY_CARE_PROVIDER_SITE_OTHER): Payer: Self-pay | Admitting: Dermatology

## 2023-11-30 ENCOUNTER — Encounter: Payer: Self-pay | Admitting: Dermatology

## 2023-11-30 DIAGNOSIS — W908XXA Exposure to other nonionizing radiation, initial encounter: Secondary | ICD-10-CM

## 2023-11-30 DIAGNOSIS — Z7189 Other specified counseling: Secondary | ICD-10-CM

## 2023-11-30 DIAGNOSIS — L819 Disorder of pigmentation, unspecified: Secondary | ICD-10-CM

## 2023-11-30 DIAGNOSIS — L578 Other skin changes due to chronic exposure to nonionizing radiation: Secondary | ICD-10-CM

## 2023-11-30 DIAGNOSIS — L988 Other specified disorders of the skin and subcutaneous tissue: Secondary | ICD-10-CM

## 2023-11-30 NOTE — Patient Instructions (Addendum)
Counseling for BBL / IPL / Laser and Coordination of Care for chest Recommend 3 sessions Recommend fall/ winter / spring   Discussed the treatment option of Broad Band Light (BBL) /Intense Pulsed Light (IPL)/ Laser for skin discoloration, including brown spots and redness.  Typically we recommend at least 1-3 treatment sessions about 5-8 weeks apart for best results.  Cannot have tanned skin when BBL performed, and regular use of sunscreen/photoprotection is advised after the procedure to help maintain results. The patient's condition may also require "maintenance treatments" in the future.  The fee for BBL / laser treatments is $350 per treatment session for the whole face.  A fee can be quoted for other parts of the body.  Insurance typically does not pay for BBL/laser treatments and therefore the fee is an out-of-pocket cost. Recommend prophylactic valtrex treatment. Once scheduled for procedure, will send Rx in prior to patient's appointment.        Due to recent changes in healthcare laws, you may see results of your pathology and/or laboratory studies on MyChart before the doctors have had a chance to review them. We understand that in some cases there may be results that are confusing or concerning to you. Please understand that not all results are received at the same time and often the doctors may need to interpret multiple results in order to provide you with the best plan of care or course of treatment. Therefore, we ask that you please give [place] 2 business days to thoroughly review all your results before contacting the office for clarification. Should we see a critical lab result, you will be contacted sooner.   If You Need Anything After Your Visit  If you have any questions or concerns for your doctor, please call our main line at [phone] and press option 4 to reach your doctor's medical assistant. If no one answers, please leave a voicemail as directed and we will return your call  as soon as possible. Messages left after 4 pm will be answered the following business day.   You may also send [place] a message via MyChart. We typically respond to MyChart messages within 1-2 business days.  For prescription refills, please ask your pharmacy to contact our office. Our fax number is [phone].  If you have an urgent issue when the clinic is closed that cannot wait until the next business day, you can page your doctor at the number below.    Please note that while we do our best to be available for urgent issues outside of office hours, we are not available 24/7.   If you have an urgent issue and are unable to reach [place], you may choose to seek medical care at your doctor's office, retail clinic, urgent care center, or emergency room.  If you have a medical emergency, please immediately call 911 or go to the emergency department.  Pager Numbers  - Dr. Gwen Pounds: [phone]  - Dr. Roseanne Reno: [phone]  - Dr. Katrinka Blazing: [phone]   In the event of inclement weather, please call our main line at [phone] for an update on the status of any delays or closures.  Dermatology Medication Tips: Please keep the boxes that topical medications come in in order to help keep track of the instructions about where and how to use these. Pharmacies typically print the medication instructions only on the boxes and not directly on the medication tubes.   If your medication is too expensive, please contact our office at [phone] option 4  or send [place] a message through MyChart.   We are unable to tell what your co-pay for medications will be in advance as this is different depending on your insurance coverage. However, we may be able to find a substitute medication at lower cost or fill out paperwork to get insurance to cover a needed medication.   If a prior authorization is required to get your medication covered by your insurance company, please allow [place] 1-2 business days to complete  this process.  Drug prices often vary depending on where the prescription is filled and some pharmacies may offer cheaper prices.  The website www.goodrx.com contains coupons for medications through different pharmacies. The prices here do not account for what the cost may be with help from insurance (it may be cheaper with your insurance), but the website can give you the price if you did not use any insurance.  - You can print the associated coupon and take it with your prescription to the pharmacy.  - You may also stop by our office during regular business hours and pick up a GoodRx coupon card.  - If you need your prescription sent electronically to a different pharmacy, notify our office through [facility] or by phone at [phone] option 4.     Si Usted Necesita Algo Despus de Su Visita  Tambin puede enviarnos un mensaje a travs de Clinical cytogeneticist. Por lo general respondemos a los mensajes de MyChart en el transcurso de 1 a 2 das hbiles.  Para renovar recetas, por favor pida a su farmacia que se ponga en contacto con nuestra oficina. Annie Sable de fax es [place] [phone].  Si tiene un asunto urgente cuando la clnica est cerrada y que no puede esperar hasta el siguiente da hbil, puede llamar/localizar a su doctor(a) al nmero que aparece a continuacin.   Por favor, tenga en cuenta que aunque hacemos todo lo posible para estar disponibles para asuntos urgentes fuera del horario de [place], no estamos disponibles las 24 horas del da, los 7 [address]  [address] de la [place].   Si tiene un problema urgente y no puede comunicarse con nosotros, puede optar por buscar atencin mdica  en el consultorio de su doctor(a), en una clnica privada, en un centro de atencin urgente o en una sala de emergencias.  Si tiene Engineer, drilling, por favor llame inmediatamente al 911 o vaya a la sala de emergencias.  Nmeros de bper  - Dr. Gwen Pounds: [phone]  - Dra. Roseanne Reno: [phone]  -  Dr. Katrinka Blazing: [phone]   En caso de inclemencias del tiempo, por favor llame a Lacy Duverney principal al [phone] para una actualizacin sobre el [place] de cualquier retraso o cierre.  Consejos para la medicacin en dermatologa: Por favor, guarde las cajas en las que vienen los medicamentos de uso tpico para ayudarle a seguir las instrucciones sobre dnde y cmo usarlos. Las farmacias generalmente imprimen las instrucciones del medicamento slo en las cajas y no directamente en los tubos del [place].   Si su medicamento es muy caro, por favor, pngase en contacto con Rolm Gala llamando al [phone] y presione la opcin 4 o envenos un mensaje a travs de Clinical cytogeneticist.   No podemos decirle cul ser su copago por los medicamentos por adelantado ya que esto es diferente dependiendo de la cobertura de su seguro. Sin embargo, es posible que podamos encontrar un medicamento sustituto a Audiological scientist un formulario para que el seguro cubra el medicamento que se considera necesario.  Si se requiere una autorizacin previa para que su compaa de seguros [place] su medicamento, por favor permtanos de 1 a 2 das hbiles para completar [address]  Los precios de los medicamentos varan con frecuencia dependiendo del Environmental consultant de dnde se surte la receta y alguna farmacias pueden ofrecer precios ms baratos.  El sitio web www.goodrx.com tiene cupones para medicamentos de Health and safety inspector. Los precios aqu no tienen en cuenta lo que podra costar con la ayuda del seguro (puede ser ms barato con su seguro), pero el sitio web puede darle el precio si no utiliz Tourist information centre manager.  - Puede imprimir el cupn correspondiente y llevarlo con su receta a la farmacia.  - Tambin puede pasar por nuestra oficina durante el horario de atencin regular y Education officer, museum una tarjeta de cupones de GoodRx.  - Si necesita que su receta se enve electrnicamente a una farmacia diferente, informe a nuestra oficina  a travs de MyChart de [place] o por telfono llamando al [phone] y presione la opcin 4.

## 2023-11-30 NOTE — Progress Notes (Signed)
   Follow-Up Visit   Subjective  Ashley Petty is a 48 y.o. female who presents for the following: Botox for facial elastosis  The following portions of the chart were reviewed this encounter and updated as appropriate: medications, allergies, medical history  Review of Systems:  No other skin or systemic complaints except as noted in HPI or Assessment and Plan.  Objective  Well appearing patient in no apparent distress; mood and affect are within normal limits.  A focused examination was performed of the face.  Relevant physical exam findings are noted in the Assessment and Plan.      Assessment & Plan   ELASTOSIS OF SKIN   Facial Elastosis  Location: See attached image  Informed consent: Discussed risks (infection, pain, bleeding, bruising, swelling, allergic reaction, paralysis of nearby muscles, eyelid droop, double vision, neck weakness, difficulty breathing, headache, undesirable cosmetic result, and need for additional treatment) and benefits of the procedure, as well as the alternatives.  Informed consent was obtained.  Preparation: The area was cleansed with alcohol.  Procedure Details:  Botox was injected into the dermis with a 30-gauge needle. Pressure applied to any bleeding. Ice packs offered for swelling.  Lot Number:  [id number] Expiration:  09/2025  Total Units Injected:  35  Plan: Tylenol may be used for headache.  Allow 2 weeks before returning to clinic for additional dosing as needed. Patient will call for any problems.  Hyperpigmentation at chest  Exam: hyperpigmentation at chest   Treatment Plan: Discussed bbl fall winter spring  Counseling for BBL / IPL / Laser and Coordination of Care Discussed the treatment option of Broad Band Light (BBL) /Intense Pulsed Light (IPL)/ Laser for skin discoloration, including brown spots and redness.  Typically we recommend at least 1-3 treatment sessions about 5-8 weeks apart for best results.  Cannot have  tanned skin when BBL performed, and regular use of sunscreen/photoprotection is advised after the procedure to help maintain results. The patient's condition may also require "maintenance treatments" in the future.  The fee for BBL / laser treatments is $350 per treatment session for the whole face.  A fee can be quoted for other parts of the body.  Insurance typically does not pay for BBL/laser treatments and therefore the fee is an out-of-pocket cost. Recommend prophylactic valtrex treatment. Once scheduled for procedure, will send Rx in prior to patient's appointment.   ACTINIC DAMAGE - chronic, secondary to cumulative UV radiation exposure/sun exposure over time - diffuse scaly erythematous macules with underlying dyspigmentation - Recommend daily broad spectrum sunscreen SPF 30+ to sun-exposed areas, reapply every 2 hours as needed.  - Recommend staying in the shade or wearing long sleeves, sun glasses (UVA+UVB protection) and wide brim hats (4-inch brim around the entire circumference of the hat). - Call for new or changing lesions.   Return for 4 to 5 month botox .  IAsher Muir, CMA, am acting as scribe for Armida Sans, MD.   Documentation: I have reviewed the above documentation for accuracy and completeness, and I agree with the above.  Armida Sans, MD

## 2024-04-11 ENCOUNTER — Ambulatory Visit (INDEPENDENT_AMBULATORY_CARE_PROVIDER_SITE_OTHER): Payer: BLUE CROSS/BLUE SHIELD | Admitting: Dermatology

## 2024-04-11 ENCOUNTER — Encounter: Payer: Self-pay | Admitting: Dermatology

## 2024-04-11 DIAGNOSIS — L988 Other specified disorders of the skin and subcutaneous tissue: Secondary | ICD-10-CM

## 2024-04-11 NOTE — Progress Notes (Signed)
   Follow-Up Visit   Subjective  Ashley Petty is a 48 y.o. female who presents for the following: Botox for facial elastosis   The following portions of the chart were reviewed this encounter and updated as appropriate: medications, allergies, medical history  Review of Systems:  No other skin or systemic complaints except as noted in HPI or Assessment and Plan.  Objective  Well appearing patient in no apparent distress; mood and affect are within normal limits.  A focused examination was performed of the face.  Relevant physical exam findings are noted in the Assessment and Plan.  Before botox photos if first time, Injection map photo     Assessment & Plan    Facial Elastosis 35 units of botox  Frown Complex - 20 units Crows Feet - 5 units for each total - 10 units Forehead - 5 units   Location: See attached image  Informed consent: Discussed risks (infection, pain, bleeding, bruising, swelling, allergic reaction, paralysis of nearby muscles, eyelid droop, double vision, neck weakness, difficulty breathing, headache, undesirable cosmetic result, and need for additional treatment) and benefits of the procedure, as well as the alternatives.  Informed consent was obtained.  Preparation: The area was cleansed with alcohol.  Procedure Details:  Botox was injected into the dermis with a 30-gauge needle. Pressure applied to any bleeding. Ice packs offered for swelling.  Lot Number:  [id number] Expiration:  02/2026  Total Units Injected:  35  Plan: Tylenol  may be used for headache.  Allow 2 weeks before returning to clinic for additional dosing as needed. Patient will call for any problems.  Return for 4 to 5 month botox .  IRandee Busing, CMA, am acting as scribe for Celine Collard, MD.   Documentation: I have reviewed the above documentation for accuracy and completeness, and I agree with the above.  Celine Collard, MD

## 2024-04-11 NOTE — Patient Instructions (Signed)

## 2024-07-19 ENCOUNTER — Ambulatory Visit: Payer: BLUE CROSS/BLUE SHIELD | Admitting: Dermatology

## 2024-09-05 ENCOUNTER — Ambulatory Visit (INDEPENDENT_AMBULATORY_CARE_PROVIDER_SITE_OTHER): Payer: Self-pay | Admitting: Dermatology

## 2024-09-05 DIAGNOSIS — L988 Other specified disorders of the skin and subcutaneous tissue: Secondary | ICD-10-CM

## 2024-09-05 NOTE — Patient Instructions (Signed)

## 2024-09-05 NOTE — Progress Notes (Unsigned)
   Follow-Up Visit   Subjective  Ashley Petty is a 48 y.o. female who presents for the following: Botox for facial elastosis  The following portions of the chart were reviewed this encounter and updated as appropriate: medications, allergies, medical history  Review of Systems:  No other skin or systemic complaints except as noted in HPI or Assessment and Plan.  Objective  Well appearing patient in no apparent distress; mood and affect are within normal limits.  A focused examination was performed of the face.  Relevant physical exam findings are noted in the Assessment and Plan.  Injection map photo     Assessment & Plan    Facial Elastosis Botox 35 units injected today to - Frown complex 20 units - Forehead 5 units - Crow's feet 5 units x 2 Location: frown complex, forehead, crow's feet  Informed consent: Discussed risks (infection, pain, bleeding, bruising, swelling, allergic reaction, paralysis of nearby muscles, eyelid droop, double vision, neck weakness, difficulty breathing, headache, undesirable cosmetic result, and need for additional treatment) and benefits of the procedure, as well as the alternatives.  Informed consent was obtained.  Preparation: The area was cleansed with alcohol.  Procedure Details:  Botox was injected into the dermis with a 30-gauge needle. Pressure applied to any bleeding. Ice packs offered for swelling.  Lot Number:  [id number] Expiration:  08/2026  Total Units Injected:  35  Plan: Tylenol  may be used for headache.  Allow 2 weeks before returning to clinic for additional dosing as needed. Patient will call for any problems.  Return for 3-40m Botox.  I, Grayce Saunas, RMA, am acting as scribe for Alm Rhyme, MD .   Documentation: I have reviewed the above documentation for accuracy and completeness, and I agree with the above.  Alm Rhyme, MD

## 2024-09-06 ENCOUNTER — Encounter: Payer: Self-pay | Admitting: Dermatology

## 2024-09-19 ENCOUNTER — Ambulatory Visit: Admitting: Dermatology

## 2025-01-16 ENCOUNTER — Ambulatory Visit: Admitting: Dermatology

## 2025-01-24 ENCOUNTER — Ambulatory Visit: Admitting: Dermatology
